# Patient Record
Sex: Female | Born: 1990 | Race: White | Hispanic: No | Marital: Married | Smoking: Current every day smoker
Health system: Southern US, Community
[De-identification: ages and names within clinical notes are randomized; demographics above are authoritative.]

## PROBLEM LIST (undated history)

## (undated) DIAGNOSIS — N939 Abnormal uterine and vaginal bleeding, unspecified: Secondary | ICD-10-CM

## (undated) HISTORY — PX: NO PAST SURGERIES: SHX2092

---
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
## 2009-09-07 ENCOUNTER — Emergency Department (HOSPITAL_COMMUNITY): Admission: EM | Admit: 2009-09-07 | Discharge: 2009-09-07 | Payer: Self-pay | Admitting: Family Medicine

## 2009-12-26 ENCOUNTER — Inpatient Hospital Stay (HOSPITAL_COMMUNITY): Admission: AD | Admit: 2009-12-26 | Discharge: 2009-12-26 | Payer: Self-pay | Admitting: Obstetrics & Gynecology

## 2009-12-27 ENCOUNTER — Inpatient Hospital Stay (HOSPITAL_COMMUNITY): Admission: AD | Admit: 2009-12-27 | Discharge: 2009-12-27 | Payer: Self-pay | Admitting: Obstetrics and Gynecology

## 2009-12-27 ENCOUNTER — Ambulatory Visit: Payer: Self-pay | Admitting: Gynecology

## 2010-06-21 LAB — GC/CHLAMYDIA PROBE AMP, GENITAL: Chlamydia, DNA Probe: NEGATIVE

## 2010-06-21 LAB — CBC
HCT: 40.6 % (ref 36.0–46.0)
Hemoglobin: 13.9 g/dL (ref 12.0–15.0)
MCV: 95.1 fL (ref 78.0–100.0)
Platelets: 259 10*3/uL (ref 150–400)

## 2010-06-21 LAB — WET PREP, GENITAL: Trich, Wet Prep: NONE SEEN

## 2010-06-21 LAB — URINALYSIS, ROUTINE W REFLEX MICROSCOPIC
Bilirubin Urine: NEGATIVE
Glucose, UA: NEGATIVE mg/dL
Nitrite: NEGATIVE
Protein, ur: NEGATIVE mg/dL
Specific Gravity, Urine: 1.02 (ref 1.005–1.030)
Urobilinogen, UA: 0.2 mg/dL (ref 0.0–1.0)

## 2010-06-25 LAB — POCT I-STAT, CHEM 8
BUN: 13 mg/dL (ref 6–23)
Chloride: 104 mEq/L (ref 96–112)
Creatinine, Ser: 0.7 mg/dL (ref 0.4–1.2)
Glucose, Bld: 88 mg/dL (ref 70–99)
Potassium: 4.1 mEq/L (ref 3.5–5.1)
Sodium: 139 mEq/L (ref 135–145)

## 2011-01-11 LAB — COMPREHENSIVE METABOLIC PANEL WITH GFR
ALT: 14
AST: 18
Albumin: 3 — ABNORMAL LOW
Alkaline Phosphatase: 217 — ABNORMAL HIGH
BUN: 7
CO2: 21
Calcium: 9
Chloride: 108
Creatinine, Ser: 0.46
Glucose, Bld: 78
Potassium: 4.1
Sodium: 136
Total Bilirubin: 0.7
Total Protein: 6.5

## 2011-01-11 LAB — CBC
HCT: 34.6 — ABNORMAL LOW
Hemoglobin: 12.2
MCHC: 35.2
MCHC: 35.3
MCV: 92.6
Platelets: 294
RBC: 3.22 — ABNORMAL LOW
RBC: 3.73 — ABNORMAL LOW
RDW: 12.7
RDW: 13
WBC: 11.7

## 2011-01-11 LAB — SYPHILIS: RPR W/REFLEX TO RPR TITER AND TREPONEMAL ANTIBODIES, TRADITIONAL SCREENING AND DIAGNOSIS ALGORITHM: RPR Ser Ql: NONREACTIVE

## 2011-01-11 LAB — URINALYSIS, DIPSTICK ONLY
Bilirubin Urine: NEGATIVE
Glucose, UA: NEGATIVE
Hgb urine dipstick: NEGATIVE
Ketones, ur: NEGATIVE
Leukocytes, UA: NEGATIVE
Nitrite: NEGATIVE
Protein, ur: NEGATIVE
Specific Gravity, Urine: 1.01
Urobilinogen, UA: 0.2
pH: 7

## 2011-01-11 LAB — URIC ACID: Uric Acid, Serum: 4.2

## 2011-01-16 LAB — RH IMMUNE GLOBULIN WORKUP (NOT WOMEN'S HOSP)
ABO/RH(D): O NEG
Antibody Screen: NEGATIVE

## 2011-01-16 LAB — GLUCOSE TOLERANCE, 1 HOUR: Glucose, 1 Hour GTT: 64 — ABNORMAL LOW

## 2011-01-18 LAB — TORCH TITERS-IGG(TOXO/ RUB/ CMV/ HSV)
CMV IgG: 0.2 IV
Rubella IgG Scr: <5 IU/mL
Toxoplasma IgG Antibody (EIA): <5 IU/mL

## 2011-01-18 LAB — TORCH-IGM(TOXO/ RUB/ CMV/ HSV) W TITER
CMV IgM: 0.61 IV
HSV IgM Antibody Titer: 1.2 IV
Toxoplasma IgM: 0.69 IV

## 2011-03-31 ENCOUNTER — Inpatient Hospital Stay (HOSPITAL_COMMUNITY): Payer: Medicaid Other

## 2011-03-31 ENCOUNTER — Emergency Department (INDEPENDENT_AMBULATORY_CARE_PROVIDER_SITE_OTHER)
Admission: EM | Admit: 2011-03-31 | Discharge: 2011-03-31 | Disposition: A | Discharge status: Home / Self Care | Payer: Medicaid Other | Source: Home / Self Care

## 2011-03-31 ENCOUNTER — Inpatient Hospital Stay (HOSPITAL_COMMUNITY)
Admission: AD | Admit: 2011-03-31 | Discharge: 2011-03-31 | Disposition: A | Discharge status: Home / Self Care | Payer: Medicaid Other | Source: Ambulatory Visit | Attending: Obstetrics & Gynecology | Admitting: Obstetrics & Gynecology

## 2011-03-31 ENCOUNTER — Encounter: Payer: Self-pay | Admitting: *Deleted

## 2011-03-31 DIAGNOSIS — A499 Bacterial infection, unspecified: Secondary | ICD-10-CM | POA: Insufficient documentation

## 2011-03-31 DIAGNOSIS — N760 Acute vaginitis: Secondary | ICD-10-CM

## 2011-03-31 DIAGNOSIS — B9689 Other specified bacterial agents as the cause of diseases classified elsewhere: Secondary | ICD-10-CM | POA: Insufficient documentation

## 2011-03-31 DIAGNOSIS — R102 Pelvic and perineal pain: Secondary | ICD-10-CM

## 2011-03-31 DIAGNOSIS — N949 Unspecified condition associated with female genital organs and menstrual cycle: Secondary | ICD-10-CM

## 2011-03-31 DIAGNOSIS — R109 Unspecified abdominal pain: Secondary | ICD-10-CM | POA: Insufficient documentation

## 2011-03-31 LAB — POCT URINALYSIS DIP (DEVICE)
Glucose, UA: NEGATIVE mg/dL
Ketones, ur: 15 mg/dL — AB
Nitrite: NEGATIVE
Protein, ur: 30 mg/dL — AB
Specific Gravity, Urine: >=1.03 (ref 1.005–1.030)
Urobilinogen, UA: 1 mg/dL (ref 0.0–1.0)
pH: 5.5 (ref 5.0–8.0)

## 2011-03-31 LAB — WET PREP, GENITAL

## 2011-03-31 MED ORDER — IBUPROFEN 800 MG PO TABS
800.0000 mg | ORAL_TABLET | Freq: Once | ORAL | Status: AC
  Administered 2011-03-31: 800 mg via ORAL

## 2011-03-31 MED ORDER — IBUPROFEN 800 MG PO TABS
ORAL_TABLET | ORAL | Status: AC
  Filled 2011-03-31: qty 1

## 2011-03-31 MED ORDER — METRONIDAZOLE 500 MG PO TABS: 500.0000 mg | ORAL_TABLET | Freq: Two times a day (BID) | ORAL | Status: AC

## 2011-03-31 NOTE — ED Notes (Signed)
Report to nurse MAU and PA spoke with NP

## 2011-03-31 NOTE — Progress Notes (Signed)
Pt reports having lower abd pain for 2 weeks. Went to urgent care and they did a pelvic exam and they could not find her string for her mirena IUD. Sent to MAU for further eval.

## 2011-03-31 NOTE — Progress Notes (Signed)
Pt presents to MAU with chief complaint of abdominal pain. Pt was sent from Urgent care for further evaluation. Non pregnant female.

## 2011-03-31 NOTE — ED Provider Notes (Signed)
History     Chief Complaint  Patient presents with  . Abdominal Pain   HPI 20 y.o. female sent from Urgent Care for further eval d/t pelvic pain, intermittent x 2 weeks. IUD in place - not able to see strings on exam at Urgent Care. Pelvic exam there revealed yellow vaginal d/c and bilateral adnexal and uterine tenderness. Pt was given Motrin 800 mg prior to leaving Urgent Care, states pain is somewhat decreased.    No past medical history on file.  No past surgical history on file.  Family History  Problem Relation Age of Onset  . Diabetes Father     History  Substance Use Topics  . Smoking status: Current Everyday Smoker  . Smokeless tobacco: Not on file  . Alcohol Use: No    Allergies: No Known Allergies  No prescriptions prior to admission    Review of Systems  Constitutional: Negative.   Respiratory: Negative.   Cardiovascular: Negative.   Gastrointestinal: Positive for abdominal pain. Negative for nausea, vomiting, diarrhea and constipation.  Genitourinary: Negative for dysuria, urgency, frequency, hematuria and flank pain.       Negative for vaginal bleeding, Positive for vaginal discharge   Musculoskeletal: Negative.   Neurological: Negative.   Psychiatric/Behavioral: Negative.    Physical Exam   Blood pressure 125/89, pulse 111, temperature 97.5 F (36.4 C), temperature source Oral, resp. rate 18, height 5' (1.524 m), weight 131 lb 12.8 oz (59.784 kg).  Physical Exam  Nursing note and vitals reviewed. Constitutional: She is oriented to person, place, and time. She appears well-developed and well-nourished. No distress.  Neurological: She is alert and oriented to person, place, and time.  Skin: Skin is warm and dry.  Psychiatric: She has a normal mood and affect.    MAU Course  Procedures  US Transvaginal Non-ob  03/31/2011  *RADIOLOGY REPORT*  Clinical Data: Pelvic pain.  IUD.  TRANSABDOMINAL AND TRANSVAGINAL ULTRASOUND OF PELVIS  Technique:   Both transabdominal and transvaginal ultrasound examinations of the pelvis were performed.  Transabdominal technique was performed for global imaging of the pelvis including uterus, ovaries, adnexal regions, and pelvic cul-de-sac.  It was necessary to proceed with endovaginal exam following the transabdominal exam to visualize the IUD and adnexa.  Comparison:  12/27/2009  Findings: Uterus:  6.8 x 3.2 x 3.8 cm.  No fibroids or other uterine masses are identified.  Endometrium: IUD is seen and normal location within the endometrial cavity. This confirmed on 3-D volume imaging.  Double layer endometrial thickness measures up to 5 mm transvaginally.  Right ovary: Normal appearance/no adnexal mass  Left ovary: Normal appearance/no adnexal mass  Other Findings:  No free fluid  IMPRESSION:  1.  Normal study.  No evidence of pelvic mass or other significant abnormality. 2.  IUD in normal position in the endometrial cavity.  Original Report Authenticated By: Danae Orleans, M.D.   US Pelvis Complete  03/31/2011  *RADIOLOGY REPORT*  Clinical Data: Pelvic pain.  IUD.  TRANSABDOMINAL AND TRANSVAGINAL ULTRASOUND OF PELVIS  Technique:  Both transabdominal and transvaginal ultrasound examinations of the pelvis were performed.  Transabdominal technique was performed for global imaging of the pelvis including uterus, ovaries, adnexal regions, and pelvic cul-de-sac.  It was necessary to proceed with endovaginal exam following the transabdominal exam to visualize the IUD and adnexa.  Comparison:  12/27/2009  Findings: Uterus:  6.8 x 3.2 x 3.8 cm.  No fibroids or other uterine masses are identified.  Endometrium: IUD is seen and  normal location within the endometrial cavity. This confirmed on 3-D volume imaging.  Double layer endometrial thickness measures up to 5 mm transvaginally.  Right ovary: Normal appearance/no adnexal mass  Left ovary: Normal appearance/no adnexal mass  Other Findings:  No free fluid  IMPRESSION:  1.  Normal  study.  No evidence of pelvic mass or other significant abnormality. 2.  IUD in normal position in the endometrial cavity.  Original Report Authenticated By: Danae Orleans, M.D.   Results for orders placed during the hospital encounter of 03/31/11 (from the past 24 hour(s))  POCT URINALYSIS DIP (DEVICE)     Status: Abnormal   Collection Time   03/31/11  1:33 PM      Component Value Range   Glucose, UA NEGATIVE  NEGATIVE (mg/dL)   Bilirubin Urine MODERATE (*) NEGATIVE    Ketones, ur 15 (*) NEGATIVE (mg/dL)   Specific Gravity, Urine >=1.030  1.005 - 1.030    Hgb urine dipstick TRACE (*) NEGATIVE    pH 5.5  5.0 - 8.0    Protein, ur 30 (*) NEGATIVE (mg/dL)   Urobilinogen, UA 1.0  0.0 - 1.0 (mg/dL)   Nitrite NEGATIVE  NEGATIVE    Leukocytes, UA TRACE (*) NEGATIVE   POCT PREGNANCY, URINE     Status: Normal   Collection Time   03/31/11  1:38 PM      Component Value Range   Preg Test, Ur NEGATIVE    WET PREP, GENITAL     Status: Abnormal   Collection Time   03/31/11  1:50 PM      Component Value Range   Yeast, Wet Prep NONE SEEN  NONE SEEN    Trich, Wet Prep NONE SEEN  NONE SEEN    Clue Cells, Wet Prep FEW (*) NONE SEEN    WBC, Wet Prep HPF POC MANY (*) NONE SEEN      Assessment and Plan  BV - rx flagyl, f/u if pain does not improve  Shannon Wolfe 03/31/2011, 3:52 PM

## 2011-03-31 NOTE — ED Provider Notes (Signed)
History     CSN: [id number]  Arrival date & time 03/31/11  1154   None     Chief Complaint  Patient presents with  . Vaginal Bleeding  . Vaginal Discharge    (Consider location/radiation/quality/duration/timing/severity/associated sxs/prior treatment) HPI Comments: Pt presents with c/o pelvic pain for "couple weeks."  Sharp intermittent pain. Pain is not changing and nothing seems to worsen or relieve pain. No dyspareunia. Vaginal dishcarge x 2 days - clear & yellow in color. Began having some light vaginal bleeding today. Has an IUD x 4 yrs. Last pelvic exam 4 yrs ago when IUD inserted and does not check her IUD strings. She denies any new sexual partners. No dysuria, urgency or frequency. BMs are regular. No fever or chills.   Patient is a 20 y.o. female presenting with vaginal bleeding and vaginal discharge.  Vaginal Bleeding Pertinent negatives include no chest pain, no abdominal pain and no shortness of breath.  Vaginal Discharge Pertinent negatives include no chest pain, no abdominal pain and no shortness of breath.    History reviewed. No pertinent past medical history.  History reviewed. No pertinent past surgical history.  Family History  Problem Relation Age of Onset  . Diabetes Father     History  Substance Use Topics  . Smoking status: Current Everyday Smoker  . Smokeless tobacco: Not on file  . Alcohol Use: No    OB History    Grav Para Term Preterm Abortions TAB SAB Ect Mult Living                  Review of Systems  Constitutional: Negative for fever and chills.  Respiratory: Negative for shortness of breath.   Cardiovascular: Negative for chest pain.  Gastrointestinal: Negative for nausea, vomiting, abdominal pain, diarrhea and constipation.  Genitourinary: Positive for vaginal bleeding, vaginal discharge and pelvic pain. Negative for dysuria, urgency, frequency and vaginal pain.    Allergies  Review of patient's allergies indicates no known  allergies.  Home Medications  No current outpatient prescriptions on file.  BP 128/84  Pulse 112  Temp(Src) 98.8 F (37.1 C) (Oral)  Resp 20  SpO2 98%  Physical Exam  Nursing note and vitals reviewed. Constitutional: She appears well-developed and well-nourished. No distress.  HENT:  Head: Normocephalic and atraumatic.  Right Ear: Tympanic membrane, external ear and ear canal normal.  Left Ear: Tympanic membrane, external ear and ear canal normal.  Nose: Nose normal.  Mouth/Throat: Uvula is midline, oropharynx is clear and moist and mucous membranes are normal. No oropharyngeal exudate, posterior oropharyngeal edema or posterior oropharyngeal erythema.  Neck: Neck supple.  Cardiovascular: Normal rate, regular rhythm and normal heart sounds.   Pulmonary/Chest: Effort normal and breath sounds normal. No respiratory distress.  Abdominal: Normal appearance and bowel sounds are normal. There is no hepatosplenomegaly. There is tenderness in the right lower quadrant, suprapubic area and left lower quadrant. There is no rebound, no guarding and no CVA tenderness.  Genitourinary: There is no rash, tenderness or lesion on the right labia. There is no rash, tenderness or lesion on the left labia. Uterus is tender. Uterus is not deviated, not enlarged and not fixed. Cervix exhibits no motion tenderness, no discharge and no friability. Right adnexum displays tenderness. Right adnexum displays no mass and no fullness. Left adnexum displays tenderness. Left adnexum displays no mass and no fullness. No erythema, tenderness or bleeding around the vagina. No foreign body around the vagina. Vaginal discharge (frothy yellow) found.  IUD strings not visible. Uterine tenderness on bimanual exam without CMT. Bilat adnexa mildly TTP. No vaginal bleeding seen.   Lymphadenopathy:    She has no cervical adenopathy.  Neurological: She is alert.  Skin: Skin is warm and dry.  Psychiatric: She has a normal mood  and affect.    ED Course  Procedures (including critical care time)  Labs Reviewed  POCT URINALYSIS DIP (DEVICE) - Abnormal; Notable for the following:    Bilirubin Urine MODERATE (*)    Ketones, ur 15 (*)    Hgb urine dipstick TRACE (*)    Protein, ur 30 (*)    Leukocytes, UA TRACE (*) Biochemical Testing Only. Please order routine urinalysis from main lab if confirmatory testing is needed.   All other components within normal limits  POCT PREGNANCY, URINE  POCT PREGNANCY, URINE  POCT URINALYSIS DIPSTICK  GC/CHLAMYDIA PROBE AMP, GENITAL  WET PREP, GENITAL   No results found.   1. Pelvic pain in female   2. Vaginitis       MDM  Pt is going to [facility] by private vehicle for further eval. Dorene Grebe at [facility] was notified.         Melody Comas, [place] 03/31/11 1406

## 2011-03-31 NOTE — Progress Notes (Signed)
Pt reports pain is worse now. Pain medication. ibuprofen and hydrocodone not working well. Reports last doses were yesterday

## 2011-03-31 NOTE — ED Notes (Signed)
Pt with onset of vaginal bleeding low abd pain onset this am  - per pt has an IUD x 4 years - has not had a period in the 4 year time  - pt also with c/o intermittent headaches

## 2011-04-01 LAB — GC/CHLAMYDIA PROBE AMP, GENITAL: Chlamydia, DNA Probe: NEGATIVE

## 2011-04-01 NOTE — ED Notes (Addendum)
GC/Chlamydia pending.  Wet prep: few clue cells, many WBC's. Pt. adequately treated with Flagyl.   Vassie Moselle 04/01/2011

## 2011-04-02 NOTE — ED Provider Notes (Signed)
Medical screening examination/treatment/procedure(s) were performed by non-physician practitioner and as supervising physician I was immediately available for consultation/collaboration.  LANEY,RONNIE   Ronnie Laney, MD 04/02/11 1211 

## 2011-04-03 NOTE — ED Notes (Signed)
GC/Chlamydia neg.  Shannon Wolfe 04/03/2011

## 2012-06-19 ENCOUNTER — Emergency Department (INDEPENDENT_AMBULATORY_CARE_PROVIDER_SITE_OTHER)
Admission: EM | Admit: 2012-06-19 | Discharge: 2012-06-19 | Disposition: A | Discharge status: Home / Self Care | Payer: Self-pay | Source: Home / Self Care

## 2012-06-19 ENCOUNTER — Encounter (HOSPITAL_COMMUNITY): Payer: Self-pay | Admitting: Emergency Medicine

## 2012-06-19 ENCOUNTER — Telehealth (HOSPITAL_COMMUNITY): Payer: Self-pay | Admitting: *Deleted

## 2012-06-19 DIAGNOSIS — A084 Viral intestinal infection, unspecified: Secondary | ICD-10-CM

## 2012-06-19 DIAGNOSIS — A088 Other specified intestinal infections: Secondary | ICD-10-CM

## 2012-06-19 MED ORDER — ONDANSETRON 4 MG PO TBDP
ORAL_TABLET | ORAL | Status: AC
  Filled 2012-06-19: qty 1

## 2012-06-19 MED ORDER — ONDANSETRON 4 MG PO TBDP
4.0000 mg | ORAL_TABLET | Freq: Once | ORAL | Status: AC
  Administered 2012-06-19: 4 mg via ORAL

## 2012-06-19 MED ORDER — ONDANSETRON HCL 4 MG PO TABS: 4.0000 mg | ORAL_TABLET | Freq: Four times a day (QID) | ORAL | Status: DC

## 2012-06-19 NOTE — ED Provider Notes (Signed)
History     CSN: [id number]  Arrival date & time 06/19/12  1141   None     Chief Complaint  Patient presents with  . Emesis    (Consider location/radiation/quality/duration/timing/severity/associated sxs/prior treatment) HPI Comments: 22 year old female developed vomiting and diarrhea last night. Each, TNTC . Has been awake most of the night with these symptoms. She is unable to hold down fluids without vomiting and diarrhea. She is also complaining of feeling cold and having sore abdominal muscles. She denies fever or GU symptoms. She is currently sitting up on the bed with relaxed posturing. Currently with no vomiting or diarrhea. She states her mother and other close relatives have recently had or are having the same symptoms.    History reviewed. No pertinent past medical history.  History reviewed. No pertinent past surgical history.  Family History  Problem Relation Age of Onset  . Diabetes Father     History  Substance Use Topics  . Smoking status: Current Every Day Smoker -- 0.50 packs/day    Types: Cigarettes  . Smokeless tobacco: Not on file  . Alcohol Use: No    OB History   Grav Para Term Preterm Abortions TAB SAB Ect Mult Living                  Review of Systems  Constitutional: Positive for activity change and fatigue. Negative for fever and chills.  HENT: Negative.   Respiratory: Negative.   Cardiovascular: Negative.   Gastrointestinal: Positive for nausea, vomiting and diarrhea. Negative for abdominal pain, constipation, blood in stool and abdominal distention.  Genitourinary: Negative.   Skin: Negative for color change, pallor and rash.  Neurological: Negative.   Psychiatric/Behavioral: Negative.     Allergies  Review of patient's allergies indicates no known allergies.  Home Medications   Current Outpatient Rx  Name  Route  Sig  Dispense  Refill  . ondansetron (ZOFRAN) 4 MG tablet   Oral   Take 1 tablet (4 mg total) by mouth every 6  (six) hours. For nausea or vomiting   15 tablet   0     BP 124/78  Pulse 64  Temp(Src) 98.3 F (36.8 C) (Oral)  Resp 18  SpO2 98%  Physical Exam  Constitutional: She is oriented to person, place, and time. She appears well-developed and well-nourished. No distress.  HENT:  Head: Normocephalic and atraumatic.  Eyes: Conjunctivae and EOM are normal.  Neck: Normal range of motion. Neck supple.  Cardiovascular: Normal rate, regular rhythm and normal heart sounds.   Pulmonary/Chest: Effort normal and breath sounds normal. No respiratory distress.  Abdominal: Soft. Bowel sounds are normal. She exhibits no distension and no mass. There is tenderness. There is no rebound and no guarding.  Mild tenderness in the epigastrium only.  Musculoskeletal: She exhibits no edema and no tenderness.  Neurological: She is alert and oriented to person, place, and time. No cranial nerve deficit.  Skin: Skin is warm and dry. There is pallor.  Psychiatric: She has a normal mood and affect.    ED Course  Procedures (including critical care time)  Labs Reviewed - No data to display No results found.   1. Viral gastroenteritis       MDM  Discharge home with a prescription of Zofran 4 mg to take by mouth q. 6 hours when necessary nausea vomiting. She is to be on a clear liquid diet for 24 hours and instructions for that are given. No solid foods for 24  hours. No milk or dairy products while having diarrhea. Start clear liquids slowly small amounts frequently. If having diarrhea greater than 4 times a day, to take one Imodium tablet and repeat in 6 hours. Do not take as instructions on the box say. You do not want to stop your diarrhea. Just slow it down. For any worsening new symptoms or problems or vomiting persistent over the hold down fluids may return.        Hayden Rasmussen, NP 06/19/12 1320  Hayden Rasmussen, NP 06/19/12 1321

## 2012-06-19 NOTE — ED Notes (Signed)
Vomiting and diarrhea since yesterday.  Reports similar symptoms 2-3 weeks ago.  Reports small specks of blood in vomit.  Patient reports soreness under ribs.  Reports generally feeling bad.

## 2012-06-19 NOTE — ED Provider Notes (Signed)
Medical screening examination/treatment/procedure(s) were performed by non-physician practitioner and as supervising physician I was immediately available for consultation/collaboration.  David Keller, M.D.  David C Keller, MD 06/19/12 2337 

## 2012-06-19 NOTE — ED Notes (Signed)
[facility] pharmacist called and said the Zofran Rx. is going to be $47.00 and pt. can't afford that. He asked if it could be changed to Phenergan for $12.00. Discussed with Dr Lorenz Coaster and he ordered Phenergan 25 mg. #20 1 q 4 hrs prn.  Pharmacist given new order. Vassie Moselle 06/19/2012

## 2012-07-22 ENCOUNTER — Encounter (HOSPITAL_COMMUNITY): Payer: Self-pay | Admitting: *Deleted

## 2012-07-22 ENCOUNTER — Other Ambulatory Visit: Payer: Self-pay

## 2012-07-22 ENCOUNTER — Emergency Department (HOSPITAL_COMMUNITY)
Admission: EM | Admit: 2012-07-22 | Discharge: 2012-07-22 | Disposition: A | Discharge status: Home / Self Care | Payer: Self-pay | Attending: Emergency Medicine | Admitting: Emergency Medicine

## 2012-07-22 ENCOUNTER — Emergency Department (HOSPITAL_COMMUNITY): Payer: Self-pay

## 2012-07-22 DIAGNOSIS — F17200 Nicotine dependence, unspecified, uncomplicated: Secondary | ICD-10-CM | POA: Insufficient documentation

## 2012-07-22 DIAGNOSIS — R0609 Other forms of dyspnea: Secondary | ICD-10-CM | POA: Insufficient documentation

## 2012-07-22 DIAGNOSIS — J9801 Acute bronchospasm: Secondary | ICD-10-CM | POA: Insufficient documentation

## 2012-07-22 DIAGNOSIS — R0989 Other specified symptoms and signs involving the circulatory and respiratory systems: Secondary | ICD-10-CM | POA: Insufficient documentation

## 2012-07-22 LAB — POCT I-STAT, CHEM 8
BUN: 7 mg/dL (ref 6–23)
Calcium, Ion: 1.2 mmol/L (ref 1.12–1.23)
Chloride: 105 mEq/L (ref 96–112)
Creatinine, Ser: 0.6 mg/dL (ref 0.50–1.10)
Glucose, Bld: 81 mg/dL (ref 70–99)

## 2012-07-22 LAB — CBC
HCT: 40.4 % (ref 36.0–46.0)
Hemoglobin: 14.5 g/dL (ref 12.0–15.0)
MCV: 88.4 fL (ref 78.0–100.0)
RBC: 4.57 MIL/uL (ref 3.87–5.11)
WBC: 11.3 10*3/uL — ABNORMAL HIGH (ref 4.0–10.5)

## 2012-07-22 MED ORDER — IPRATROPIUM BROMIDE 0.02 % IN SOLN
0.5000 mg | Freq: Once | RESPIRATORY_TRACT | Status: AC
  Administered 2012-07-22: 0.5 mg via RESPIRATORY_TRACT
  Filled 2012-07-22: qty 2.5

## 2012-07-22 MED ORDER — ALBUTEROL SULFATE HFA 108 (90 BASE) MCG/ACT IN AERS
2.0000 | INHALATION_SPRAY | RESPIRATORY_TRACT | Status: DC | PRN
  Administered 2012-07-22: 2 via RESPIRATORY_TRACT
  Filled 2012-07-22: qty 6.7

## 2012-07-22 MED ORDER — ALBUTEROL SULFATE (5 MG/ML) 0.5% IN NEBU
2.5000 mg | INHALATION_SOLUTION | Freq: Once | RESPIRATORY_TRACT | Status: AC
  Administered 2012-07-22: 2.5 mg via RESPIRATORY_TRACT
  Filled 2012-07-22: qty 0.5

## 2012-07-22 MED ORDER — PREDNISONE 20 MG PO TABS: 60.0000 mg | ORAL_TABLET | Freq: Every day | ORAL | Status: DC

## 2012-07-22 MED ORDER — PREDNISONE 20 MG PO TABS
60.0000 mg | ORAL_TABLET | Freq: Once | ORAL | Status: AC
  Administered 2012-07-22: 60 mg via ORAL
  Filled 2012-07-22: qty 3

## 2012-07-22 NOTE — ED Provider Notes (Signed)
History     CSN: [id number]  Arrival date & time 07/22/12  1725   First MD Initiated Contact with Patient 07/22/12 1923      Chief Complaint  Patient presents with  . Chest Pain    (Consider location/radiation/quality/duration/timing/severity/associated sxs/prior treatment) Patient is a 22 y.o. female presenting with chest pain. The history is provided by the patient.  Chest Pain She has been having chest pain for the last 3 days. It is described as like somebody sitting on her chest. It is worse when she is supine and better when sitting up. There is associated dyspnea but no nausea, vomiting, diaphoresis. She denies any cough or fever or chills. She thought it may have been the strap of her block causing problems but she tried adjusting her bra with no benefit. She is a cigarette smoker smoking half pack of cigarettes a day. She denies birth control pill use (she uses Mirena), recent surgery, recent long distance travel, or history of DVT.  History reviewed. No pertinent past medical history.  History reviewed. No pertinent past surgical history.  Family History  Problem Relation Age of Onset  . Diabetes Father     History  Substance Use Topics  . Smoking status: Current Every Day Smoker -- 0.50 packs/day    Types: Cigarettes  . Smokeless tobacco: Not on file  . Alcohol Use: No    OB History   Grav Para Term Preterm Abortions TAB SAB Ect Mult Living                  Review of Systems  Cardiovascular: Positive for chest pain.  All other systems reviewed and are negative.    Allergies  Review of patient's allergies indicates no known allergies.  Home Medications   Current Outpatient Rx  Name  Route  Sig  Dispense  Refill  . ondansetron (ZOFRAN) 4 MG tablet   Oral   Take 1 tablet (4 mg total) by mouth every 6 (six) hours. For nausea or vomiting   15 tablet   0     BP 132/84  Pulse 90  Temp(Src) 97.9 F (36.6 C) (Oral)  Resp 18  Ht 5' (1.524 m)  SpO2  100%  Physical Exam  Nursing note and vitals reviewed.  22 year old female, resting comfortably and in no acute distress. Vital signs are normal. Oxygen saturation is 100%, which is normal. Head is normocephalic and atraumatic. PERRLA, EOMI. Oropharynx is clear. Neck is nontender and supple without adenopathy or JVD. Back is nontender and there is no CVA tenderness. Lungs are clear without rales, wheezes, or rhonchi. There is relatively diminished air flow which seems to be related to poor effort but there is no clearly prolonged exhalation phase. Chest is nontender. Heart has regular rate and rhythm without murmur. Abdomen is soft, flat, nontender without masses or hepatosplenomegaly and peristalsis is normoactive. Extremities have no cyanosis or edema, full range of motion is present. Skin is warm and dry without rash. Neurologic: Mental status is normal, cranial nerves are intact, there are no motor or sensory deficits.  ED Course  Procedures (including critical care time)  Results for orders placed during the hospital encounter of 07/22/12  CBC      Result Value Range   WBC 11.3 (*) 4.0 - 10.5 K/uL   RBC 4.57  3.87 - 5.11 MIL/uL   Hemoglobin 14.5  12.0 - 15.0 g/dL   HCT 21.3  08.6 - 57.8 %   MCV 88.4  78.0 - 100.0 fL   MCH 31.7  26.0 - 34.0 pg   MCHC 35.9  30.0 - 36.0 g/dL   RDW 45.4  09.8 - 11.9 %   Platelets 305  150 - 400 K/uL  POCT I-STAT, CHEM 8      Result Value Range   Sodium 141  135 - 145 mEq/L   Potassium 4.0  3.5 - 5.1 mEq/L   Chloride 105  96 - 112 mEq/L   BUN 7  6 - 23 mg/dL   Creatinine, Ser 1.47  0.50 - 1.10 mg/dL   Glucose, Bld 81  70 - 99 mg/dL   Calcium, Ion 8.29  1.12 - 1.23 mmol/L   TCO2 24  0 - 100 mmol/L   Hemoglobin 15.3 (*) 12.0 - 15.0 g/dL   HCT 56.2  13.0 - 86.5 %  POCT I-STAT TROPONIN I      Result Value Range   Troponin i, poc 0.00  0.00 - 0.08 ng/mL   Comment 3            Dg Chest 2 View  07/22/2012  *RADIOLOGY REPORT*  Clinical Data:  Chest pain, shortness of breath when lying down  CHEST - 2 VIEW  Comparison: None.  Findings: Cardiomediastinal silhouette is within normal limits. The lungs are clear. No pleural effusion.  No pneumothorax.  No acute osseous abnormality.  IMPRESSION: Normal chest.   Original Report Authenticated By: Christiana Pellant, M.D.     ECG shows normal sinus rhythm with a rate of 89, no ectopy. Normal axis. Normal P wave. Normal QRS. Normal intervals. Normal ST and T waves. Impression: normal ECG. No prior ECG available for comparison.   1. Bronchospasm       MDM  Chest heaviness and dyspnea which is suspicious for bronchospasm. She'll be given a therapeutic trial of albuterol with Atrovent. ECG, chest x-ray, and laboratory workup were all unremarkable. She does not have any risk factor for DVT or pulmonary embolism.  She got good relief of all symptoms with albuterol with Atrovent. She's given a dose of prednisone and an albuterol inhaler, and discharged with prescription for prednisone. She is encouraged to stop smoking.      Dione Booze, MD 07/22/12 2010

## 2012-07-22 NOTE — ED Notes (Signed)
Pt c/o sternal chest pressure and sob x 3 days whenever she lays down. Denies nausea.  VS stable.

## 2012-10-26 ENCOUNTER — Inpatient Hospital Stay (HOSPITAL_COMMUNITY)
Admission: AD | Admit: 2012-10-26 | Discharge: 2012-10-26 | Disposition: A | Discharge status: Home / Self Care | Payer: Medicaid Other | Source: Ambulatory Visit | Attending: Obstetrics & Gynecology | Admitting: Obstetrics & Gynecology

## 2012-10-26 DIAGNOSIS — N949 Unspecified condition associated with female genital organs and menstrual cycle: Secondary | ICD-10-CM | POA: Insufficient documentation

## 2012-10-26 DIAGNOSIS — Z30431 Encounter for routine checking of intrauterine contraceptive device: Secondary | ICD-10-CM | POA: Insufficient documentation

## 2012-10-26 DIAGNOSIS — Z3202 Encounter for pregnancy test, result negative: Secondary | ICD-10-CM | POA: Insufficient documentation

## 2012-10-26 DIAGNOSIS — N938 Other specified abnormal uterine and vaginal bleeding: Secondary | ICD-10-CM | POA: Insufficient documentation

## 2012-10-26 NOTE — MAU Note (Signed)
Pt states has IUD in place, has had continual spotting/bleeding for past 7 months. Here for pregnancy test. Has taken tests at home, all negative. IUD placed by [facility]. Denies pain at present. Here for upt only.

## 2012-10-26 NOTE — MAU Provider Note (Signed)
Ms. Shannon Wolfe is a 22 y.o. female who presents to MAU today for UPT. The patient states that she had IUD placed ~ 7 months ago and has had irregular spotting since then. She is concerned that she might be pregnant despite multiple negative HPTs. She has not followed-up in the office. Patient denies any other problems today and just requests UPT.   BP 113/72  Pulse 88  Temp(Src) 97.9 F (36.6 C) (Oral)  Resp 16  Ht 5' (1.524 m)  Wt 128 lb 2 oz (58.117 kg)  BMI 25.02 kg/m2 GENERAL: Well-developed, well-nourished female in no acute distress.  HEENT: Normocephalic, atraumatic.   LUNGS: Effort normal HEART: Regular rate  SKIN: Warm, dry and without erythema PSYCH: Normal mood and affect  Results for orders placed during the hospital encounter of 10/26/12 (from the past 24 hour(s))  POCT PREGNANCY, URINE     Status: None   Collection Time    10/26/12  1:06 PM      Result Value Range   Preg Test, Ur NEGATIVE  NEGATIVE    A: Negative pregnancy test Irregular bleeding with IUD  P: Discharge home Patient advised that irregular spotting with IUD is a common side effect Patient encouraged to follow-up with [facility] if symptoms persist or worsen Patient may return to MAU as needed or if her condition were to change or worsen  Freddi Starr, PA-C 10/26/2012 2:12 PM

## 2012-11-07 ENCOUNTER — Encounter (HOSPITAL_COMMUNITY): Payer: Self-pay

## 2012-11-07 ENCOUNTER — Emergency Department (HOSPITAL_COMMUNITY)
Admission: EM | Admit: 2012-11-07 | Discharge: 2012-11-07 | Disposition: A | Discharge status: Home / Self Care | Payer: Medicaid Other | Source: Home / Self Care

## 2012-11-07 DIAGNOSIS — J029 Acute pharyngitis, unspecified: Secondary | ICD-10-CM

## 2012-11-07 DIAGNOSIS — J309 Allergic rhinitis, unspecified: Secondary | ICD-10-CM

## 2012-11-07 LAB — POCT RAPID STREP A: Streptococcus, Group A Screen (Direct): NEGATIVE

## 2012-11-07 MED ORDER — METHYLPREDNISOLONE 4 MG PO KIT: PACK | ORAL | Status: DC

## 2012-11-07 MED ORDER — PHENYLEPHRINE-CHLORPHEN-DM 10-4-12.5 MG/5ML PO LIQD: 5.0000 mL | ORAL | Status: DC | PRN

## 2012-11-07 MED ORDER — KETOTIFEN FUMARATE 0.025 % OP SOLN: 1.0000 [drp] | Freq: Two times a day (BID) | OPHTHALMIC | Status: DC

## 2012-11-07 NOTE — ED Provider Notes (Signed)
CSN: [id number]     Arrival date & time 11/07/12  0900 History     First MD Initiated Contact with Patient 11/07/12 [phone]     Chief Complaint  Patient presents with  . Cough   (Consider location/radiation/quality/duration/timing/severity/associated sxs/prior Treatment) HPI Comments: For 1 week cough, PND, eyes itchy and draining, PND, sorethroat   Patient is a 22 y.o. female presenting with cough. The history is provided by the patient.  Cough Cough characteristics:  Productive and hacking Sputum characteristics:  Yellow Severity:  Moderate Onset quality:  Gradual Duration:  7 days Timing:  Intermittent Progression:  Unchanged Chronicity:  New Context: exposure to allergens, smoke exposure and weather changes   Relieved by:  Nothing Worsened by:  Smoking Associated symptoms: rhinorrhea   Associated symptoms: no chills, no fever and no rash     History reviewed. No pertinent past medical history. History reviewed. No pertinent past surgical history. Family History  Problem Relation Age of Onset  . Diabetes Father    History  Substance Use Topics  . Smoking status: Current Every Day Smoker -- 0.50 packs/day    Types: Cigarettes  . Smokeless tobacco: Not on file  . Alcohol Use: No   OB History   Grav Para Term Preterm Abortions TAB SAB Ect Mult Living                 Review of Systems  Constitutional: Negative for fever, chills, activity change, appetite change and fatigue.  HENT: Positive for congestion, rhinorrhea and postnasal drip. Negative for facial swelling, neck pain and neck stiffness.   Eyes: Negative.   Respiratory: Positive for cough.   Cardiovascular: Negative.   Skin: Negative for pallor and rash.  Neurological: Negative.     Allergies  Review of patient's allergies indicates no known allergies.  Home Medications   Current Outpatient Rx  Name  Route  Sig  Dispense  Refill  . ketotifen (ZADITOR) 0.025 % ophthalmic solution   Both Eyes   Place  1 drop into both eyes 2 (two) times daily.   5 mL   0   . methylPREDNISolone (MEDROL DOSEPAK) 4 MG tablet      follow package directions. Take with food   21 tablet   0   . Phenylephrine-Chlorphen-DM 01-10-11.5 MG/5ML LIQD   Oral   Take 5 mLs by mouth every 4 (four) hours as needed. May cause drowsiness   120 mL   0    BP 116/78  Pulse 93  Temp(Src) 98.1 F (36.7 C) (Oral)  Resp 19  SpO2 97% Physical Exam  Nursing note and vitals reviewed. Constitutional: She is oriented to person, place, and time. She appears well-developed and well-nourished. No distress.  HENT:  Right Ear: External ear normal.  Left Ear: External ear normal.  OP with redness. No swelling or exudates  Eyes: Conjunctivae and EOM are normal.  Neck: Normal range of motion. Neck supple.  Cardiovascular: Normal rate and regular rhythm.   Pulmonary/Chest: Effort normal. No respiratory distress. She has wheezes. She has no rales.  Musculoskeletal: Normal range of motion. She exhibits no edema.  Lymphadenopathy:    She has no cervical adenopathy.  Neurological: She is alert and oriented to person, place, and time.  Skin: Skin is warm and dry. No rash noted.  Psychiatric: She has a normal mood and affect.    ED Course   Procedures (including critical care time)  Labs Reviewed  POCT RAPID STREP A ([facility] CARE ONLY)  No results found. 1. Allergic pharyngitis   2. Allergic rhinitis due to allergen     MDM  Medrol dosepack norel cs zaditor opthalmic  Hayden Rasmussen, NP 11/07/12 0945

## 2012-11-07 NOTE — ED Provider Notes (Signed)
Medical screening examination/treatment/procedure(s) were performed by resident physician or non-physician practitioner and as supervising physician I was immediately available for consultation/collaboration.   Barkley Bruns MD.   Linna Hoff, MD 11/07/12 [phone]

## 2012-11-07 NOTE — ED Notes (Addendum)
C/o 2 week duration of cough, , yellow and blood streaked secretions; c/o pain in right ear, right eye reddened, lid swollen;' chest clear to ascultation. Posterior nasopharynx reddened

## 2012-11-09 LAB — CULTURE, GROUP A STREP

## 2012-11-10 NOTE — ED Notes (Signed)
Call from pharmacy stating that medicaid does not cover phenylephrine-chlorphen-DM. Per Dr. Artis Flock Rx changed to 10 mg zyrtec # 30 with no refills. Mw,cma

## 2012-12-02 ENCOUNTER — Encounter: Payer: Self-pay | Admitting: Obstetrics & Gynecology

## 2012-12-02 ENCOUNTER — Ambulatory Visit (INDEPENDENT_AMBULATORY_CARE_PROVIDER_SITE_OTHER): Payer: Medicaid Other | Admitting: Obstetrics & Gynecology

## 2012-12-02 VITALS — BP 112/75 | HR 94 | Temp 98.2°F | Ht 60.0 in | Wt 128.8 lb

## 2012-12-02 DIAGNOSIS — Z30432 Encounter for removal of intrauterine contraceptive device: Secondary | ICD-10-CM

## 2012-12-02 DIAGNOSIS — N760 Acute vaginitis: Secondary | ICD-10-CM

## 2012-12-02 MED ORDER — NORETHIN-ETH ESTRAD-FE BIPHAS 1 MG-10 MCG / 10 MCG PO TABS: 1.0000 | ORAL_TABLET | Freq: Every day | ORAL | Status: DC

## 2012-12-02 NOTE — Progress Notes (Signed)
.   Subjective:     Shannon Wolfe is a 22 y.o. female here for her Mirena removal.  She had it inserted 01/21/12.  Current complaints:She says that she does not feel like herself.   Personal health questionnaire reviewed: no.   Gynecologic History No LMP recorded. Patient is not currently having periods (Reason: IUD). Contraception: IUD Last Pap: N/A Last mammogram: N/A  Obstetric History OB History  No data available     The following portions of the patient's history were reviewed and updated as appropriate: allergies, current medications, past family history, past medical history, past social history, past surgical history and problem list.  Review of Systems Pertinent items are noted in HPI.    Objective:     SPEC:  IUD strings present; the IUD was removed intact; thin, yellow vaginal discharge     Assessment:    S/P IUD removal   Plan:   COCP Check wet prep by molecular probe Return prn

## 2012-12-02 NOTE — Patient Instructions (Signed)

## 2013-01-21 ENCOUNTER — Ambulatory Visit: Payer: Medicaid Other | Admitting: Obstetrics & Gynecology

## 2013-02-15 ENCOUNTER — Ambulatory Visit: Payer: Medicaid Other | Admitting: Obstetrics & Gynecology

## 2013-03-29 ENCOUNTER — Emergency Department (HOSPITAL_COMMUNITY)
Admission: EM | Admit: 2013-03-29 | Discharge: 2013-03-29 | Disposition: A | Discharge status: Home / Self Care | Payer: Medicaid Other | Source: Home / Self Care | Attending: Family Medicine | Admitting: Family Medicine

## 2013-03-29 ENCOUNTER — Other Ambulatory Visit (HOSPITAL_COMMUNITY)
Admission: RE | Admit: 2013-03-29 | Discharge: 2013-03-29 | Disposition: A | Discharge status: Home / Self Care | Payer: Medicaid Other | Source: Ambulatory Visit | Attending: Family Medicine | Admitting: Family Medicine

## 2013-03-29 ENCOUNTER — Encounter (HOSPITAL_COMMUNITY): Payer: Self-pay | Admitting: Emergency Medicine

## 2013-03-29 DIAGNOSIS — Z113 Encounter for screening for infections with a predominantly sexual mode of transmission: Secondary | ICD-10-CM | POA: Insufficient documentation

## 2013-03-29 DIAGNOSIS — K219 Gastro-esophageal reflux disease without esophagitis: Secondary | ICD-10-CM

## 2013-03-29 DIAGNOSIS — N760 Acute vaginitis: Secondary | ICD-10-CM | POA: Insufficient documentation

## 2013-03-29 LAB — POCT URINALYSIS DIP (DEVICE)
Bilirubin Urine: NEGATIVE
Ketones, ur: NEGATIVE mg/dL
Protein, ur: NEGATIVE mg/dL
Specific Gravity, Urine: 1.02 (ref 1.005–1.030)
pH: 7.5 (ref 5.0–8.0)

## 2013-03-29 MED ORDER — GI COCKTAIL ~~LOC~~
ORAL | Status: AC
  Filled 2013-03-29: qty 30

## 2013-03-29 MED ORDER — GI COCKTAIL ~~LOC~~
30.0000 mL | Freq: Once | ORAL | Status: AC
  Administered 2013-03-29: 30 mL via ORAL

## 2013-03-29 MED ORDER — ESOMEPRAZOLE MAGNESIUM 40 MG PO CPDR: 40.0000 mg | DELAYED_RELEASE_CAPSULE | Freq: Every day | ORAL | Status: DC

## 2013-03-29 NOTE — ED Notes (Signed)
C/o sob with exertion.  Tightness/pressure in chest.  Dizziness worse with lying down.   Denies hx of asthma.  Smokes 1/2 pack daily

## 2013-03-29 NOTE — ED Provider Notes (Signed)
CSN: [id number]     Arrival date & time 03/29/13  1637 History   First MD Initiated Contact with Patient 03/29/13 1738     Chief Complaint  Patient presents with  . Shortness of Breath   (Consider location/radiation/quality/duration/timing/severity/associated sxs/prior Treatment) Patient is a 22 y.o. female presenting with shortness of breath. The history is provided by the patient.  Shortness of Breath Severity:  Moderate Onset quality:  Gradual Duration:  3 months Timing:  Intermittent Progression:  Waxing and waning Chronicity:  New Worsened by:  Emotional stress Associated symptoms: cough   Associated symptoms: no abdominal pain, no chest pain, no fever and no wheezing   Risk factors: tobacco use   Risk factors comment:  Smoker   History reviewed. No pertinent past medical history. History reviewed. No pertinent past surgical history. Family History  Problem Relation Age of Onset  . Diabetes Father    History  Substance Use Topics  . Smoking status: Current Every Day Smoker -- 0.50 packs/day    Types: Cigarettes  . Smokeless tobacco: Not on file  . Alcohol Use: No   OB History   Grav Para Term Preterm Abortions TAB SAB Ect Mult Living                 Review of Systems  Constitutional: Negative.  Negative for fever.  HENT: Negative.   Respiratory: Positive for cough and shortness of breath. Negative for wheezing.   Cardiovascular: Negative for chest pain, palpitations and leg swelling.  Gastrointestinal: Negative for abdominal pain.    Allergies  Review of patient's allergies indicates no known allergies.  Home Medications   Current Outpatient Rx  Name  Route  Sig  Dispense  Refill  . esomeprazole (NEXIUM) 40 MG capsule   Oral   Take 1 capsule (40 mg total) by mouth daily.   30 capsule   1   . ketotifen (ZADITOR) 0.025 % ophthalmic solution   Both Eyes   Place 1 drop into both eyes 2 (two) times daily.   5 mL   0   . methylPREDNISolone (MEDROL  DOSEPAK) 4 MG tablet      follow package directions. Take with food   21 tablet   0   . Norethindrone-Ethinyl Estradiol-Fe Biphas (LO LOESTRIN FE) 1 MG-10 MCG / 10 MCG tablet   Oral   Take 1 tablet by mouth daily.   1 Package   11   . Phenylephrine-Chlorphen-DM 01-10-11.5 MG/5ML LIQD   Oral   Take 5 mLs by mouth every 4 (four) hours as needed. May cause drowsiness   120 mL   0    BP 121/82  Pulse 88  Temp(Src) 98 F (36.7 C) (Oral)  Resp 14  SpO2 99% Physical Exam  Nursing note and vitals reviewed. Constitutional: She is oriented to person, place, and time. She appears well-developed and well-nourished.  HENT:  Head: Normocephalic.  Right Ear: External ear normal.  Left Ear: External ear normal.  Mouth/Throat: Oropharynx is clear and moist.  Neck: Normal range of motion. Neck supple.  Cardiovascular: Normal rate, regular rhythm, normal heart sounds and intact distal pulses.   Pulmonary/Chest: Effort normal and breath sounds normal.  Lymphadenopathy:    She has no cervical adenopathy.  Neurological: She is alert and oriented to person, place, and time.  Skin: Skin is warm and dry.    ED Course  Procedures (including critical care time) Labs Review Labs Reviewed  POCT PREGNANCY, URINE  POCT URINALYSIS DIP (DEVICE)  URINE CYTOLOGY ANCILLARY ONLY   Imaging Review No results found.  EKG Interpretation    Date/Time:    Ventricular Rate:    PR Interval:    QRS Duration:   QT Interval:    QTC Calculation:   R Axis:     Text Interpretation:              MDM  Sx improved after meds.    Linna Hoff, MD 03/29/13 [phone]

## 2013-03-30 ENCOUNTER — Encounter (HOSPITAL_COMMUNITY): Payer: Self-pay | Admitting: Emergency Medicine

## 2013-03-30 ENCOUNTER — Emergency Department (INDEPENDENT_AMBULATORY_CARE_PROVIDER_SITE_OTHER)
Admission: EM | Admit: 2013-03-30 | Discharge: 2013-03-30 | Disposition: A | Discharge status: Home / Self Care | Payer: Medicaid Other | Source: Home / Self Care | Attending: Family Medicine | Admitting: Family Medicine

## 2013-03-30 DIAGNOSIS — A0811 Acute gastroenteropathy due to Norwalk agent: Secondary | ICD-10-CM

## 2013-03-30 DIAGNOSIS — K219 Gastro-esophageal reflux disease without esophagitis: Secondary | ICD-10-CM

## 2013-03-30 MED ORDER — ONDANSETRON 4 MG PO TBDP
8.0000 mg | ORAL_TABLET | Freq: Once | ORAL | Status: AC
  Administered 2013-03-30: 8 mg via ORAL

## 2013-03-30 MED ORDER — ONDANSETRON HCL 4 MG PO TABS: 4.0000 mg | ORAL_TABLET | Freq: Four times a day (QID) | ORAL | Status: DC

## 2013-03-30 MED ORDER — ONDANSETRON 4 MG PO TBDP
ORAL_TABLET | ORAL | Status: AC
Start: 2013-03-30 — End: 2013-03-30
  Filled 2013-03-30: qty 2

## 2013-03-30 MED ORDER — OMEPRAZOLE 40 MG PO CPDR: 40.0000 mg | DELAYED_RELEASE_CAPSULE | Freq: Every day | ORAL | Status: DC

## 2013-03-30 NOTE — ED Notes (Signed)
Patient called to c/o her medication is not covered under her insurance, and has also developed abdominal pain, diarrhea.Advised if she has new syx, she needs to be rechecked

## 2013-03-30 NOTE — ED Notes (Signed)
Pt  Reports     She       Was seen  Last        Pm  For   Nausea   Vomiting    And      geard     She  Reports  Today  She  Has  Had  Vomiting             And  Chills  With  Body  Aches         Pt     Reports  Did  Not  Get  Her  meds  Filled  Because  Of  Cost

## 2013-03-30 NOTE — ED Provider Notes (Signed)
CSN: [id number]     Arrival date & time 03/30/13  1305 History   First MD Initiated Contact with Patient 03/30/13 1534     Chief Complaint  Patient presents with  . Nausea   (Consider location/radiation/quality/duration/timing/severity/associated sxs/prior Treatment) Patient is a 22 y.o. female presenting with vomiting. The history is provided by the patient.  Emesis Severity:  Moderate Duration:  1 day Timing:  Intermittent Quality:  Stomach contents Progression:  Unchanged Chronicity:  New (seen last eve at [facility] for gerd, developed n/v after leaving here, didn't get meds b/o cost, here for recheck.) Associated symptoms: diarrhea     History reviewed. No pertinent past medical history. History reviewed. No pertinent past surgical history. Family History  Problem Relation Age of Onset  . Diabetes Father    History  Substance Use Topics  . Smoking status: Current Every Day Smoker -- 0.50 packs/day    Types: Cigarettes  . Smokeless tobacco: Not on file  . Alcohol Use: No   OB History   Grav Para Term Preterm Abortions TAB SAB Ect Mult Living                 Review of Systems  Constitutional: Negative.   Gastrointestinal: Positive for nausea, vomiting and diarrhea.    Allergies  Review of patient's allergies indicates no known allergies.  Home Medications   Current Outpatient Rx  Name  Route  Sig  Dispense  Refill  . esomeprazole (NEXIUM) 40 MG capsule   Oral   Take 1 capsule (40 mg total) by mouth daily.   30 capsule   1   . ketotifen (ZADITOR) 0.025 % ophthalmic solution   Both Eyes   Place 1 drop into both eyes 2 (two) times daily.   5 mL   0   . methylPREDNISolone (MEDROL DOSEPAK) 4 MG tablet      follow package directions. Take with food   21 tablet   0   . Norethindrone-Ethinyl Estradiol-Fe Biphas (LO LOESTRIN FE) 1 MG-10 MCG / 10 MCG tablet   Oral   Take 1 tablet by mouth daily.   1 Package   11   . omeprazole (PRILOSEC) 40 MG capsule  Oral   Take 1 capsule (40 mg total) by mouth daily.   30 capsule   1   . ondansetron (ZOFRAN) 4 MG tablet   Oral   Take 1 tablet (4 mg total) by mouth every 6 (six) hours. Prn n/v   6 tablet   0   . Phenylephrine-Chlorphen-DM 01-10-11.5 MG/5ML LIQD   Oral   Take 5 mLs by mouth every 4 (four) hours as needed. May cause drowsiness   120 mL   0    BP 114/79  Pulse 84  Temp(Src) 98.4 F (36.9 C) (Oral)  Resp 16  SpO2 100% Physical Exam  Nursing note and vitals reviewed. Constitutional: She is oriented to person, place, and time. She appears well-developed and well-nourished.  Neck: Normal range of motion. Neck supple.  Abdominal: Soft. Bowel sounds are normal. There is no tenderness.  Neurological: She is alert and oriented to person, place, and time.  Skin: Skin is warm and dry.    ED Course  Procedures (including critical care time) Labs Review Labs Reviewed - No data to display Imaging Review No results found.  EKG Interpretation    Date/Time:    Ventricular Rate:    PR Interval:    QRS Duration:   QT Interval:    QTC  Calculation:   R Axis:     Text Interpretation:              MDM      Linna Hoff, MD 03/30/13 [phone]

## 2013-04-03 ENCOUNTER — Telehealth (HOSPITAL_COMMUNITY): Payer: Self-pay

## 2013-04-03 MED ORDER — AZITHROMYCIN 250 MG PO TABS: ORAL_TABLET | ORAL | Status: DC

## 2013-04-04 ENCOUNTER — Telehealth (HOSPITAL_COMMUNITY): Payer: Self-pay | Admitting: Emergency Medicine

## 2013-04-04 MED ORDER — AZITHROMYCIN 500 MG PO TABS: ORAL_TABLET | ORAL | Status: DC

## 2013-04-04 NOTE — Telephone Encounter (Signed)
Message copied by Reuben Likes on Sun Apr 04, 2013  6:36 PM ------      Message from: Vassie Moselle      Created: Sun Apr 04, 2013  5:46 PM      Regarding: lab       Chlamydia pos.  Needs treatment. Pt. of Dr. Artis Flock.      Vassie Moselle      04/04/2013       ------

## 2013-04-04 NOTE — Telephone Encounter (Signed)
Patient aware of lab results and treatment

## 2013-04-04 NOTE — ED Notes (Signed)
DNA probe for Chlamydia came back positive. She was not treated for this when she was here. Will need azithromycin 1000 mg.  Reuben Likes, MD 04/04/13 [phone]

## 2013-04-05 ENCOUNTER — Telehealth (HOSPITAL_COMMUNITY): Payer: Self-pay | Admitting: *Deleted

## 2013-04-05 NOTE — ED Notes (Addendum)
Chart reviewed and Dr. Artis Flock had done an order for Zithromax 12/27, Dr. Lorenz Coaster did one on 12/28 and Hale Drone CMA called one to [facility] 12/29.  I called pt. Pt. verified x 2 and given results.  Pt. told she only needs the one Rx. of Zithromax.  I told her to take it with food and call back if she vomits up the medication.  Pt. instructed to notify her partner (she said he was already treated), no sex for 1 week and to practice safe sex. Pt. told she can get HIV testing at the [facility]. STD clinic, by appointment.  Pt. voiced understanding.  I called [facility] on [address] to tell them the pt. only needs one Rx. of Zithromax. They received 3 orders. She said she sees 2.  I told her one on is your VM from today.  She asked which one should she use.  I told her to use Dr. Blair Heys. Vassie Moselle 04/05/2013 DHHS form completed and faxed to the [facility] Department.

## 2013-04-05 NOTE — ED Notes (Signed)
Called in Azithromycin 1000mg  to [facility] on [address]... LM on their VM Called pt an notified her.

## 2013-08-02 ENCOUNTER — Emergency Department (HOSPITAL_COMMUNITY)
Admission: EM | Admit: 2013-08-02 | Discharge: 2013-08-02 | Disposition: A | Discharge status: Home / Self Care | Payer: Medicaid Other | Source: Home / Self Care | Attending: Emergency Medicine | Admitting: Emergency Medicine

## 2013-08-02 ENCOUNTER — Encounter (HOSPITAL_COMMUNITY): Payer: Self-pay | Admitting: Emergency Medicine

## 2013-08-02 DIAGNOSIS — L03319 Cellulitis of trunk, unspecified: Secondary | ICD-10-CM

## 2013-08-02 DIAGNOSIS — T63481A Toxic effect of venom of other arthropod, accidental (unintentional), initial encounter: Secondary | ICD-10-CM

## 2013-08-02 DIAGNOSIS — L02219 Cutaneous abscess of trunk, unspecified: Secondary | ICD-10-CM

## 2013-08-02 DIAGNOSIS — X58XXXA Exposure to other specified factors, initial encounter: Secondary | ICD-10-CM

## 2013-08-02 DIAGNOSIS — T6391XA Toxic effect of contact with unspecified venomous animal, accidental (unintentional), initial encounter: Secondary | ICD-10-CM

## 2013-08-02 DIAGNOSIS — L03311 Cellulitis of abdominal wall: Secondary | ICD-10-CM

## 2013-08-02 MED ORDER — DIPHENHYDRAMINE-ZINC ACETATE 1-0.1 % EX CREA: TOPICAL_CREAM | Freq: Three times a day (TID) | CUTANEOUS | Status: DC | PRN

## 2013-08-02 MED ORDER — DOXYCYCLINE HYCLATE 100 MG PO CAPS: 100.0000 mg | ORAL_CAPSULE | Freq: Two times a day (BID) | ORAL | Status: DC

## 2013-08-02 NOTE — ED Notes (Signed)
C/o insect bite on mid stomach area States area is painful and radiates to belly button States area does itch Pus has came out of a small puncture wound on stomach which was yellowish and white

## 2013-08-02 NOTE — ED Provider Notes (Signed)
CSN: [id number]     Arrival date & time 08/02/13  0109 History   First MD Initiated Contact with Patient 08/02/13 [phone]     Chief Complaint  Patient presents with  . Insect Bite   (Consider location/radiation/quality/duration/timing/severity/associated sxs/prior Treatment) HPI Comments: States she thinks she was stung by an insect 2 days ago while walking in woods. Was stung on lower anterior abdomen and area has become more red, tender and swollen over the past two days. No medications taken at home. No fever/chills. PCP: [place]  The history is provided by the patient.    History reviewed. No pertinent past medical history. History reviewed. No pertinent past surgical history. Family History  Problem Relation Age of Onset  . Diabetes Father    History  Substance Use Topics  . Smoking status: Current Every Day Smoker -- 0.50 packs/day    Types: Cigarettes  . Smokeless tobacco: Not on file  . Alcohol Use: No   OB History   Grav Para Term Preterm Abortions TAB SAB Ect Mult Living                 Review of Systems  All other systems reviewed and are negative.   Allergies  Review of patient's allergies indicates no known allergies.  Home Medications   Prior to Admission medications   Medication Sig Start Date End Date Taking? Authorizing Provider  azithromycin (ZITHROMAX) 250 MG tablet Take all 4 pills as one dose, one time 04/03/13   Billy Fischer, MD  azithromycin (ZITHROMAX) 500 MG tablet Take 2 tablets at one time. 04/04/13   Harden Mo, MD  esomeprazole (NEXIUM) 40 MG capsule Take 1 capsule (40 mg total) by mouth daily. 03/29/13   Billy Fischer, MD  ketotifen (ZADITOR) 0.025 % ophthalmic solution Place 1 drop into both eyes 2 (two) times daily. 11/07/12   Janne Napoleon, NP  methylPREDNISolone (MEDROL DOSEPAK) 4 MG tablet follow package directions. Take with food 11/07/12   Janne Napoleon, NP  Norethindrone-Ethinyl Estradiol-Fe Biphas (LO LOESTRIN FE) 1 MG-10  MCG / 10 MCG tablet Take 1 tablet by mouth daily. 12/02/12   Lahoma Crocker, MD  omeprazole (PRILOSEC) 40 MG capsule Take 1 capsule (40 mg total) by mouth daily. 03/30/13   Billy Fischer, MD  ondansetron (ZOFRAN) 4 MG tablet Take 1 tablet (4 mg total) by mouth every 6 (six) hours. Prn n/v 03/30/13   Billy Fischer, MD  Phenylephrine-Chlorphen-DM 01-10-11.5 MG/5ML LIQD Take 5 mLs by mouth every 4 (four) hours as needed. May cause drowsiness 11/07/12   Janne Napoleon, NP   BP 114/78  Pulse 81  Temp(Src) 97.7 F (36.5 C) (Oral)  Resp 16  SpO2 99% Physical Exam  Nursing note and vitals reviewed. Constitutional: She is oriented to person, place, and time. She appears well-developed and well-nourished. No distress.  HENT:  Head: Normocephalic and atraumatic.  Eyes: Conjunctivae are normal.  Cardiovascular: Normal rate.   Pulmonary/Chest: Effort normal.  Abdominal: Soft. Bowel sounds are normal.    Outline area is area of erythema and induration without palpable fluctuance. Small grey stinger visible in center of lesion. This was removed with forceps.   Musculoskeletal: Normal range of motion.  Neurological: She is alert and oriented to person, place, and time.  Skin: Skin is warm and dry. Rash noted. There is erythema.  Psychiatric: She has a normal mood and affect. Her behavior is normal.    ED Course  Procedures (including critical care  time) Labs Review Labs Reviewed - No data to display  Imaging Review No results found.   MDM   1. Insect sting   2. Cellulitis of abdominal wall    Warm compresses QID, tylenol or ibuprofen for discomfort. Topical benadryl for itch and doxycycline as prescribed for cellulitis. Area outlined with surgical pen and patient advised to return for re-check in 48 hours.     [place], [place] 08/02/13 1014

## 2013-08-02 NOTE — Discharge Instructions (Signed)
Warm compresses 4 x day until healed. Medications as prescribed. Return for re-check in 2 days. Tylenol or ibuprofen for pain Cellulitis Cellulitis is an infection of the skin and the tissue beneath it. The infected area is usually red and tender. Cellulitis occurs most often in the arms and lower legs.  CAUSES  Cellulitis is caused by bacteria that enter the skin through cracks or cuts in the skin. The most common types of bacteria that cause cellulitis are Staphylococcus and Streptococcus. SYMPTOMS   Redness and warmth.  Swelling.  Tenderness or pain.  Fever. DIAGNOSIS  Your caregiver can usually determine what is wrong based on a physical exam. Blood tests may also be done. TREATMENT  Treatment usually involves taking an antibiotic medicine. HOME CARE INSTRUCTIONS   Take your antibiotics as directed. Finish them even if you start to feel better.  Keep the infected arm or leg elevated to reduce swelling.  Apply a warm cloth to the affected area up to 4 times per day to relieve pain.  Only take over-the-counter or prescription medicines for pain, discomfort, or fever as directed by your caregiver.  Keep all follow-up appointments as directed by your caregiver. SEEK MEDICAL CARE IF:   You notice red streaks coming from the infected area.  Your red area gets larger or turns dark in color.  Your bone or joint underneath the infected area becomes painful after the skin has healed.  Your infection returns in the same area or another area.  You notice a swollen bump in the infected area.  You develop new symptoms. SEEK IMMEDIATE MEDICAL CARE IF:   You have a fever.  You feel very sleepy.  You develop vomiting or diarrhea.  You have a general ill feeling (malaise) with muscle aches and pains. MAKE SURE YOU:   Understand these instructions.  Will watch your condition.  Will get help right away if you are not doing well or get worse. Document Released: 01/02/2005  Document Revised: 09/24/2011 Document Reviewed: 06/10/2011 [facility] Patient Information 2014 [place].

## 2013-08-02 NOTE — ED Provider Notes (Signed)
Medical screening examination/treatment/procedure(s) were performed by non-physician practitioner and as supervising physician I was immediately available for consultation/collaboration.  Philipp Deputy, M.D.  Harden Mo, MD 08/02/13 [phone]

## 2013-08-04 ENCOUNTER — Encounter (HOSPITAL_COMMUNITY): Payer: Self-pay | Admitting: Emergency Medicine

## 2013-08-04 ENCOUNTER — Emergency Department (INDEPENDENT_AMBULATORY_CARE_PROVIDER_SITE_OTHER)
Admission: EM | Admit: 2013-08-04 | Discharge: 2013-08-04 | Disposition: A | Discharge status: Home / Self Care | Payer: Medicaid Other | Source: Home / Self Care | Attending: Family Medicine | Admitting: Family Medicine

## 2013-08-04 DIAGNOSIS — L02219 Cutaneous abscess of trunk, unspecified: Secondary | ICD-10-CM

## 2013-08-04 DIAGNOSIS — L03319 Cellulitis of trunk, unspecified: Secondary | ICD-10-CM

## 2013-08-04 DIAGNOSIS — L03311 Cellulitis of abdominal wall: Secondary | ICD-10-CM

## 2013-08-04 NOTE — Discharge Instructions (Signed)
Cellulitis Cellulitis is an infection of the skin and the tissue beneath it. The infected area is usually red and tender. Cellulitis occurs most often in the arms and lower legs.  CAUSES  Cellulitis is caused by bacteria that enter the skin through cracks or cuts in the skin. The most common types of bacteria that cause cellulitis are Staphylococcus and Streptococcus. SYMPTOMS   Redness and warmth.  Swelling.  Tenderness or pain.  Fever. DIAGNOSIS  Your caregiver can usually determine what is wrong based on a physical exam. Blood tests may also be done. TREATMENT  Treatment usually involves taking an antibiotic medicine. HOME CARE INSTRUCTIONS   Take your antibiotics as directed. Finish them even if you start to feel better.  Keep the infected arm or leg elevated to reduce swelling.  Apply a warm cloth to the affected area up to 4 times per day to relieve pain.  Only take over-the-counter or prescription medicines for pain, discomfort, or fever as directed by your caregiver.  Keep all follow-up appointments as directed by your caregiver. SEEK MEDICAL CARE IF:   You notice red streaks coming from the infected area.  Your red area gets larger or turns dark in color.  Your bone or joint underneath the infected area becomes painful after the skin has healed.  Your infection returns in the same area or another area.  You notice a swollen bump in the infected area.  You develop new symptoms. SEEK IMMEDIATE MEDICAL CARE IF:   You have a fever.  You feel very sleepy.  You develop vomiting or diarrhea.  You have a general ill feeling (malaise) with muscle aches and pains. MAKE SURE YOU:   Understand these instructions.  Will watch your condition.  Will get help right away if you are not doing well or get worse. Document Released: 01/02/2005 Document Revised: 09/24/2011 Document Reviewed: 06/10/2011 [employer] Patient Information 2014 [employer].  

## 2013-08-04 NOTE — ED Notes (Signed)
Here for wound check . Initial visit 4-27. Patient states upper area of inflammation feels  Better, but redness has extended towards lower abdominal area

## 2013-08-04 NOTE — ED Provider Notes (Signed)
CSN: [id number]     Arrival date & time 08/04/13  1055 History   First MD Initiated Contact with Patient 08/04/13 1112     Chief Complaint  Patient presents with  . Wound Check   (Consider location/radiation/quality/duration/timing/severity/associated sxs/prior Treatment) HPI Comments: Patient presented and was diagnosed with lower abdominal wall cellulitis on 08-02-2013 and was prescribed oral doxycycline. Is taking medication as prescribed and reports improvement. Is here for follow up as instructed.   Patient is a 23 y.o. female presenting with wound check. The history is provided by the patient.  Wound Check    History reviewed. No pertinent past medical history. History reviewed. No pertinent past surgical history. Family History  Problem Relation Age of Onset  . Diabetes Father    History  Substance Use Topics  . Smoking status: Current Every Day Smoker -- 0.50 packs/day    Types: Cigarettes  . Smokeless tobacco: Not on file  . Alcohol Use: No   OB History   Grav Para Term Preterm Abortions TAB SAB Ect Mult Living                 Review of Systems  Constitutional: Negative for fever and chills.  All other systems reviewed and are negative.   Allergies  Review of patient's allergies indicates no known allergies.  Home Medications   Prior to Admission medications   Medication Sig Start Date End Date Taking? Authorizing Provider  azithromycin (ZITHROMAX) 250 MG tablet Take all 4 pills as one dose, one time 04/03/13  Yes Billy Fischer, MD  azithromycin (ZITHROMAX) 500 MG tablet Take 2 tablets at one time. 04/04/13  Yes Harden Mo, MD  diphenhydrAMINE-zinc acetate (BENADRYL) cream Apply topically 3 (three) times daily as needed for itching. 08/02/13  Yes Lahoma Rocker, PA  doxycycline (VIBRAMYCIN) 100 MG capsule Take 1 capsule (100 mg total) by mouth 2 (two) times daily. X 7 days 08/02/13  Yes Annett Gula Griffey Nicasio, PA  esomeprazole (NEXIUM) 40 MG capsule Take  1 capsule (40 mg total) by mouth daily. 03/29/13   Billy Fischer, MD  ketotifen (ZADITOR) 0.025 % ophthalmic solution Place 1 drop into both eyes 2 (two) times daily. 11/07/12   Janne Napoleon, NP  methylPREDNISolone (MEDROL DOSEPAK) 4 MG tablet follow package directions. Take with food 11/07/12   Janne Napoleon, NP  Norethindrone-Ethinyl Estradiol-Fe Biphas (LO LOESTRIN FE) 1 MG-10 MCG / 10 MCG tablet Take 1 tablet by mouth daily. 12/02/12   Lahoma Crocker, MD  omeprazole (PRILOSEC) 40 MG capsule Take 1 capsule (40 mg total) by mouth daily. 03/30/13   Billy Fischer, MD  ondansetron (ZOFRAN) 4 MG tablet Take 1 tablet (4 mg total) by mouth every 6 (six) hours. Prn n/v 03/30/13   Billy Fischer, MD  Phenylephrine-Chlorphen-DM 01-10-11.5 MG/5ML LIQD Take 5 mLs by mouth every 4 (four) hours as needed. May cause drowsiness 11/07/12   Janne Napoleon, NP   BP 115/86  Pulse 86  Temp(Src) 99 F (37.2 C) (Oral)  Resp 10  SpO2 100% Physical Exam  Nursing note and vitals reviewed. Constitutional: She is oriented to person, place, and time. She appears well-developed and well-nourished. No distress.  HENT:  Head: Normocephalic and atraumatic.  Eyes: Conjunctivae are normal.  Cardiovascular: Normal rate.   Pulmonary/Chest: Effort normal.  Abdominal: Soft.    Small residual area of mild erythema. Noticeable improvement from 08-02-2012 visit. No induration. No fluctuance.   Musculoskeletal: Normal range of motion.  Neurological: She is  alert and oriented to person, place, and time.  Skin: Skin is warm and dry.  Psychiatric: She has a normal mood and affect. Her behavior is normal.    ED Course  Procedures (including critical care time) Labs Review Labs Reviewed - No data to display  Imaging Review No results found.   MDM   1. Cellulitis of abdominal wall    Overall improvement. Continue warm compresses QID until healed. Topical benadryl or 1% hydrocortisone cream for itching and complete doxycycline as  prescribed. Follow up prn. Majority of involved area has receded within borders drawn with surgical marker on 08-02-2013.     [place], [place] 08/04/13 1139

## 2013-08-04 NOTE — ED Provider Notes (Signed)
Medical screening examination/treatment/procedure(s) were performed by a resident physician or non-physician practitioner and as the supervising physician I was immediately available for consultation/collaboration.  Lynne Leader, MD    Gregor Hams, MD 08/04/13 [phone]

## 2013-09-09 ENCOUNTER — Emergency Department (HOSPITAL_COMMUNITY)
Admission: EM | Admit: 2013-09-09 | Discharge: 2013-09-09 | Disposition: A | Discharge status: Home / Self Care | Payer: Medicaid Other | Source: Home / Self Care | Attending: Family Medicine | Admitting: Family Medicine

## 2013-09-09 ENCOUNTER — Encounter (HOSPITAL_COMMUNITY): Payer: Self-pay | Admitting: Emergency Medicine

## 2013-09-09 DIAGNOSIS — R0789 Other chest pain: Secondary | ICD-10-CM

## 2013-09-09 DIAGNOSIS — R071 Chest pain on breathing: Secondary | ICD-10-CM

## 2013-09-09 MED ORDER — DICLOFENAC POTASSIUM 50 MG PO TABS: 50.0000 mg | ORAL_TABLET | Freq: Three times a day (TID) | ORAL | Status: DC

## 2013-09-09 NOTE — ED Provider Notes (Signed)
CSN: [id number]     Arrival date & time 09/09/13  1706 History   First MD Initiated Contact with Patient 09/09/13 1732     Chief Complaint  Patient presents with  . Chest Pain   (Consider location/radiation/quality/duration/timing/severity/associated sxs/prior Treatment) Patient is a 23 y.o. female presenting with chest pain. The history is provided by the patient.  Chest Pain Pain location:  Substernal area Pain quality: sharp   Pain radiates to:  Does not radiate Pain radiates to the back: no   Pain severity:  Mild Onset quality:  Gradual Duration:  1 day Progression:  Unchanged Chronicity:  New Context: movement   Relieved by:  None tried Worsened by:  Nothing tried Ineffective treatments:  None tried Associated symptoms: no abdominal pain, no back pain, no palpitations and no shortness of breath     History reviewed. No pertinent past medical history. History reviewed. No pertinent past surgical history. Family History  Problem Relation Age of Onset  . Diabetes Father    History  Substance Use Topics  . Smoking status: Current Every Day Smoker -- 0.50 packs/day    Types: Cigarettes  . Smokeless tobacco: Not on file  . Alcohol Use: No   OB History   Grav Para Term Preterm Abortions TAB SAB Ect Mult Living                 Review of Systems  Constitutional: Negative.   Respiratory: Negative for chest tightness, shortness of breath and wheezing.   Cardiovascular: Positive for chest pain. Negative for palpitations and leg swelling.  Gastrointestinal: Negative.  Negative for abdominal pain.  Musculoskeletal: Negative for back pain.    Allergies  Review of patient's allergies indicates no known allergies.  Home Medications   Prior to Admission medications   Medication Sig Start Date End Date Taking? Authorizing Provider  azithromycin (ZITHROMAX) 250 MG tablet Take all 4 pills as one dose, one time 04/03/13   Billy Fischer, MD  azithromycin (ZITHROMAX) 500 MG  tablet Take 2 tablets at one time. 04/04/13   Harden Mo, MD  diclofenac (CATAFLAM) 50 MG tablet Take 1 tablet (50 mg total) by mouth 3 (three) times daily. Prn chest wall pain 09/09/13   Billy Fischer, MD  diphenhydrAMINE-zinc acetate (BENADRYL) cream Apply topically 3 (three) times daily as needed for itching. 08/02/13   Lahoma Rocker, PA  doxycycline (VIBRAMYCIN) 100 MG capsule Take 1 capsule (100 mg total) by mouth 2 (two) times daily. X 7 days 08/02/13   Lahoma Rocker, PA  esomeprazole (NEXIUM) 40 MG capsule Take 1 capsule (40 mg total) by mouth daily. 03/29/13   Billy Fischer, MD  ketotifen (ZADITOR) 0.025 % ophthalmic solution Place 1 drop into both eyes 2 (two) times daily. 11/07/12   Janne Napoleon, NP  methylPREDNISolone (MEDROL DOSEPAK) 4 MG tablet follow package directions. Take with food 11/07/12   Janne Napoleon, NP  Norethindrone-Ethinyl Estradiol-Fe Biphas (LO LOESTRIN FE) 1 MG-10 MCG / 10 MCG tablet Take 1 tablet by mouth daily. 12/02/12   Lahoma Crocker, MD  omeprazole (PRILOSEC) 40 MG capsule Take 1 capsule (40 mg total) by mouth daily. 03/30/13   Billy Fischer, MD  ondansetron (ZOFRAN) 4 MG tablet Take 1 tablet (4 mg total) by mouth every 6 (six) hours. Prn n/v 03/30/13   Billy Fischer, MD  Phenylephrine-Chlorphen-DM 01-10-11.5 MG/5ML LIQD Take 5 mLs by mouth every 4 (four) hours as needed. May cause drowsiness 11/07/12   Shanon Brow  Mabe, NP   BP 106/62  Pulse 94  Temp(Src) 98.3 F (36.8 C) (Oral)  Resp 16  SpO2 98% Physical Exam  Nursing note and vitals reviewed. Constitutional: She is oriented to person, place, and time. She appears well-developed and well-nourished.  Neck: Normal range of motion. Neck supple. No thyromegaly present.  Cardiovascular: Normal heart sounds.   Pulmonary/Chest: Effort normal and breath sounds normal. She exhibits tenderness.  Neurological: She is alert and oriented to person, place, and time.  Skin: Skin is warm and dry.    ED Course   Procedures (including critical care time) Labs Review Labs Reviewed - No data to display  Imaging Review No results found.   MDM   1. Anterior chest wall pain        Billy Fischer, MD 09/09/13 [phone]

## 2013-09-09 NOTE — ED Notes (Signed)
Pt  Reports  Last  Pm  She  Developed chest  Pain   aggrevated by  Movement  And    Certain    posistions     Sitting upright on     Exam table   Speaking in  Complete  sentances

## 2014-01-05 ENCOUNTER — Encounter (HOSPITAL_COMMUNITY): Payer: Self-pay | Admitting: Emergency Medicine

## 2014-01-05 ENCOUNTER — Emergency Department (HOSPITAL_COMMUNITY)
Admission: EM | Admit: 2014-01-05 | Discharge: 2014-01-05 | Disposition: A | Discharge status: Home / Self Care | Payer: Medicaid Other | Source: Home / Self Care | Attending: Emergency Medicine | Admitting: Emergency Medicine

## 2014-01-05 DIAGNOSIS — J410 Simple chronic bronchitis: Secondary | ICD-10-CM

## 2014-01-05 DIAGNOSIS — K219 Gastro-esophageal reflux disease without esophagitis: Secondary | ICD-10-CM

## 2014-01-05 MED ORDER — BENZONATATE 100 MG PO CAPS: 100.0000 mg | ORAL_CAPSULE | Freq: Three times a day (TID) | ORAL | Status: DC | PRN

## 2014-01-05 MED ORDER — PREDNISONE 20 MG PO TABS
ORAL_TABLET | ORAL | Status: AC
  Filled 2014-01-05: qty 3

## 2014-01-05 MED ORDER — OMEPRAZOLE 20 MG PO CPDR: 20.0000 mg | DELAYED_RELEASE_CAPSULE | Freq: Every day | ORAL | Status: DC

## 2014-01-05 MED ORDER — ALBUTEROL SULFATE HFA 108 (90 BASE) MCG/ACT IN AERS: 1.0000 | INHALATION_SPRAY | Freq: Four times a day (QID) | RESPIRATORY_TRACT | Status: DC | PRN

## 2014-01-05 MED ORDER — PREDNISONE 20 MG PO TABS
60.0000 mg | ORAL_TABLET | Freq: Once | ORAL | Status: AC
  Administered 2014-01-05: 60 mg via ORAL

## 2014-01-05 NOTE — ED Notes (Signed)
Pt states that she is having back pain and cough over the past few days she states back pain is lower mid back. Pt is in no acute distress at this time VSS.

## 2014-01-05 NOTE — ED Provider Notes (Signed)
CSN: [id number]     Arrival date & time 01/05/14  1517 History   First MD Initiated Contact with Patient 01/05/14 0912     Chief Complaint  Patient presents with  . Cough  . Back Pain   (Consider location/radiation/quality/duration/timing/severity/associated sxs/prior Treatment) HPI Comments: PCP: none unemployed  Patient is a 23 y.o. female presenting with cough and back pain. The history is provided by the patient.  Cough Cough characteristics:  Non-productive Severity:  Moderate Onset quality:  Gradual Duration: +several weeks. Timing: states symptoms primarily occur and mornings and evenings. Chronicity:  Chronic Associated symptoms: no chest pain, no chills, no diaphoresis, no fever, no rash, no rhinorrhea, no shortness of breath and no weight loss   Associated symptoms comment:  No hemoptysis Risk factors comment:  +smoker (1PPD) Back Pain Associated symptoms: no chest pain, no fever and no weight loss     History reviewed. No pertinent past medical history. History reviewed. No pertinent past surgical history. Family History  Problem Relation Age of Onset  . Diabetes Father    History  Substance Use Topics  . Smoking status: Current Every Day Smoker -- 0.50 packs/day    Types: Cigarettes  . Smokeless tobacco: Not on file  . Alcohol Use: No   OB History   Grav Para Term Preterm Abortions TAB SAB Ect Mult Living                 Review of Systems  Constitutional: Negative for fever, chills, weight loss and diaphoresis.  HENT: Negative for rhinorrhea.   Respiratory: Positive for cough. Negative for shortness of breath.   Cardiovascular: Negative for chest pain.  Musculoskeletal: Positive for back pain.  Skin: Negative for rash.  All other systems reviewed and are negative.   Allergies  Review of patient's allergies indicates no known allergies.  Home Medications   Prior to Admission medications   Medication Sig Start Date End Date Taking? Authorizing  Provider  albuterol (PROVENTIL HFA;VENTOLIN HFA) 108 (90 BASE) MCG/ACT inhaler Inhale 1-2 puffs into the lungs every 6 (six) hours as needed for wheezing or shortness of breath. 01/05/14   Lutricia Feil, PA  azithromycin (ZITHROMAX) 250 MG tablet Take all 4 pills as one dose, one time 04/03/13   Billy Fischer, MD  azithromycin (ZITHROMAX) 500 MG tablet Take 2 tablets at one time. 04/04/13   Harden Mo, MD  benzonatate (TESSALON) 100 MG capsule Take 1 capsule (100 mg total) by mouth 3 (three) times daily as needed for cough. 01/05/14   Audelia Hives Kaylynn Chamblin, PA  diclofenac (CATAFLAM) 50 MG tablet Take 1 tablet (50 mg total) by mouth 3 (three) times daily. Prn chest wall pain 09/09/13   Billy Fischer, MD  diphenhydrAMINE-zinc acetate (BENADRYL) cream Apply topically 3 (three) times daily as needed for itching. 08/02/13   Audelia Hives Alyda Megna, PA  doxycycline (VIBRAMYCIN) 100 MG capsule Take 1 capsule (100 mg total) by mouth 2 (two) times daily. X 7 days 08/02/13   Audelia Hives Jelisha Weed, PA  esomeprazole (NEXIUM) 40 MG capsule Take 1 capsule (40 mg total) by mouth daily. 03/29/13   Billy Fischer, MD  ketotifen (ZADITOR) 0.025 % ophthalmic solution Place 1 drop into both eyes 2 (two) times daily. 11/07/12   Janne Napoleon, NP  methylPREDNISolone (MEDROL DOSEPAK) 4 MG tablet follow package directions. Take with food 11/07/12   Janne Napoleon, NP  Norethindrone-Ethinyl Estradiol-Fe Biphas (LO LOESTRIN FE) 1 MG-10 MCG / 10  MCG tablet Take 1 tablet by mouth daily. 12/02/12   Lahoma Crocker, MD  omeprazole (PRILOSEC) 20 MG capsule Take 1 capsule (20 mg total) by mouth daily. 01/05/14   Audelia Hives Keshana Klemz, PA  omeprazole (PRILOSEC) 40 MG capsule Take 1 capsule (40 mg total) by mouth daily. 03/30/13   Billy Fischer, MD  ondansetron (ZOFRAN) 4 MG tablet Take 1 tablet (4 mg total) by mouth every 6 (six) hours. Prn n/v 03/30/13   Billy Fischer, MD  Phenylephrine-Chlorphen-DM 01-10-11.5 MG/5ML LIQD Take 5 mLs by  mouth every 4 (four) hours as needed. May cause drowsiness 11/07/12   Janne Napoleon, NP   BP 117/78  Pulse 82  Temp(Src) 98.9 F (37.2 C) (Oral)  Resp 16  SpO2 99%  LMP 12/27/2013 Physical Exam  Nursing note and vitals reviewed. Constitutional: She is oriented to person, place, and time. She appears well-developed and well-nourished. No distress.  HENT:  Head: Normocephalic and atraumatic.  Right Ear: Hearing, tympanic membrane, external ear and ear canal normal.  Left Ear: Hearing, tympanic membrane, external ear and ear canal normal.  Nose: Nose normal.  Mouth/Throat: Uvula is midline, oropharynx is clear and moist and mucous membranes are normal.  Eyes: Conjunctivae are normal. No scleral icterus.  Neck: Normal range of motion. Neck supple.  Cardiovascular: Normal rate, regular rhythm and normal heart sounds.   Pulmonary/Chest: No respiratory distress. She has wheezes.  +moderate end expiratory wheezes  Abdominal: Soft. Bowel sounds are normal. She exhibits no distension. There is no tenderness.  Musculoskeletal: Normal range of motion.  Lymphadenopathy:    She has no cervical adenopathy.  Neurological: She is alert and oriented to person, place, and time.  Skin: Skin is warm and dry. No rash noted. No erythema.  Psychiatric: She has a normal mood and affect. Her behavior is normal.    ED Course  Procedures (including critical care time) Labs Review Labs Reviewed - No data to display  Imaging Review No results found.   MDM   1. Simple chronic bronchitis   2. Gastroesophageal reflux disease without esophagitis    Hx and exam suggests chronic bronchitis as a result of smoking. Patient also mentions that she has had some GERD sx and this could certainly be contributing to her chronic cough. Will treat with bronchodilators, tessalon perles and prilosec. Strongly encouraged to discontinue smoking and to locate a PCP of her choice for follow up should symptoms persist.      Lutricia Feil, PA 01/05/14 1029

## 2014-01-05 NOTE — Discharge Instructions (Signed)
Cough, Adult  A cough is a reflex that helps clear your throat and airways. It can help heal the body or may be a reaction to an irritated airway. A cough may only last 2 or 3 weeks (acute) or may last more than 8 weeks (chronic).  CAUSES Acute cough:  Viral or bacterial infections. Chronic cough:  Infections.  Allergies.  Asthma.  Post-nasal drip.  Smoking.  Heartburn or acid reflux.  Some medicines.  Chronic lung problems (COPD).  Cancer. SYMPTOMS   Cough.  Fever.  Chest pain.  Increased breathing rate.  High-pitched whistling sound when breathing (wheezing).  Colored mucus that you cough up (sputum). TREATMENT   A bacterial cough may be treated with antibiotic medicine.  A viral cough must run its course and will not respond to antibiotics.  Your caregiver may recommend other treatments if you have a chronic cough. HOME CARE INSTRUCTIONS   Only take over-the-counter or prescription medicines for pain, discomfort, or fever as directed by your caregiver. Use cough suppressants only as directed by your caregiver.  Use a cold steam vaporizer or humidifier in your bedroom or home to help loosen secretions.  Sleep in a semi-upright position if your cough is worse at night.  Rest as needed.  Stop smoking if you smoke. SEEK IMMEDIATE MEDICAL CARE IF:   You have pus in your sputum.  Your cough starts to worsen.  You cannot control your cough with suppressants and are losing sleep.  You begin coughing up blood.  You have difficulty breathing.  You develop pain which is getting worse or is uncontrolled with medicine.  You have a fever. MAKE SURE YOU:   Understand these instructions.  Will watch your condition.  Will get help right away if you are not doing well or get worse. Document Released: 09/21/2010 Document Revised: 06/17/2011 Document Reviewed: 09/21/2010 Exit[employer] Patient Information 2015 [employer]. This information is not intended  to replace advice given to you by your health care provider. Make sure you discuss any questions you have with your health care provider. Gastroesophageal Reflux Disease, Adult Gastroesophageal reflux disease (GERD) happens when acid from your stomach flows up into the esophagus. When acid comes in contact with the esophagus, the acid causes soreness (inflammation) in the esophagus. Over time, GERD may create small holes (ulcers) in the lining of the esophagus. CAUSES   Increased body weight. This puts pressure on the stomach, making acid rise from the stomach into the esophagus.  Smoking. This increases acid production in the stomach.  Drinking alcohol. This causes decreased pressure in the lower esophageal sphincter (valve or ring of muscle between the esophagus and stomach), allowing acid from the stomach into the esophagus.  Late evening meals and a full stomach. This increases pressure and acid production in the stomach.  A malformed lower esophageal sphincter. Sometimes, no cause is found. SYMPTOMS   Burning pain in the lower part of the mid-chest behind the breastbone and in the mid-stomach area. This may occur twice a week or more often.  Trouble swallowing.  Sore throat.  Dry cough.  Asthma-like symptoms including chest tightness, shortness of breath, or wheezing. DIAGNOSIS  Your caregiver may be able to diagnose GERD based on your symptoms. In some cases, X-rays and other tests may be done to check for complications or to check the condition of your stomach and esophagus. TREATMENT  Your caregiver may recommend over-the-counter or prescription medicines to help decrease acid production. Ask your caregiver before starting or   adding any new medicines.  HOME CARE INSTRUCTIONS   Change the factors that you can control. Ask your caregiver for guidance concerning weight loss, quitting smoking, and alcohol consumption.  Avoid foods and drinks that make your symptoms worse, such  as:  Caffeine or alcoholic drinks.  Chocolate.  Peppermint or mint flavorings.  Garlic and onions.  Spicy foods.  Citrus fruits, such as oranges, lemons, or limes.  Tomato-based foods such as sauce, chili, salsa, and pizza.  Fried and fatty foods.  Avoid lying down for the 3 hours prior to your bedtime or prior to taking a nap.  Eat small, frequent meals instead of large meals.  Wear loose-fitting clothing. Do not wear anything tight around your waist that causes pressure on your stomach.  Raise the head of your bed 6 to 8 inches with wood blocks to help you sleep. Extra pillows will not help.  Only take over-the-counter or prescription medicines for pain, discomfort, or fever as directed by your caregiver.  Do not take aspirin, ibuprofen, or other nonsteroidal anti-inflammatory drugs (NSAIDs). SEEK IMMEDIATE MEDICAL CARE IF:   You have pain in your arms, neck, jaw, teeth, or back.  Your pain increases or changes in intensity or duration.  You develop nausea, vomiting, or sweating (diaphoresis).  You develop shortness of breath, or you faint.  Your vomit is green, yellow, black, or looks like coffee grounds or blood.  Your stool is red, bloody, or black. These symptoms could be signs of other problems, such as heart disease, gastric bleeding, or esophageal bleeding. MAKE SURE YOU:   Understand these instructions.  Will watch your condition.  Will get help right away if you are not doing well or get worse. Document Released: 01/02/2005 Document Revised: 06/17/2011 Document Reviewed: 10/12/2010 ExitCare Patient Information 2015 [employer]. This information is not intended to replace advice given to you by your health care provider. Make sure you discuss any questions you have with your health care provider. Food Choices for Gastroesophageal Reflux Disease When you have gastroesophageal reflux disease (GERD), the foods you eat and your eating habits are very  important. Choosing the right foods can help ease the discomfort of GERD. WHAT GENERAL GUIDELINES DO I NEED TO FOLLOW?  Choose fruits, vegetables, whole grains, low-fat dairy products, and low-fat meat, fish, and poultry.  Limit fats such as oils, salad dressings, butter, nuts, and avocado.  Keep a food diary to identify foods that cause symptoms.  Avoid foods that cause reflux. These may be different for different people.  Eat frequent small meals instead of three large meals each day.  Eat your meals slowly, in a relaxed setting.  Limit fried foods.  Cook foods using methods other than frying.  Avoid drinking alcohol.  Avoid drinking large amounts of liquids with your meals.  Avoid bending over or lying down until 2-3 hours after eating. WHAT FOODS ARE NOT RECOMMENDED? The following are some foods and drinks that may worsen your symptoms: Vegetables Tomatoes. Tomato juice. Tomato and spaghetti sauce. Chili peppers. Onion and garlic. Horseradish. Fruits Oranges, grapefruit, and lemon (fruit and juice). Meats High-fat meats, fish, and poultry. This includes hot dogs, ribs, ham, sausage, salami, and bacon. Dairy Whole milk and chocolate milk. Sour cream. Cream. Butter. Ice cream. Cream cheese.  Beverages Coffee and tea, with or without caffeine. Carbonated beverages or energy drinks. Condiments Hot sauce. Barbecue sauce.  Sweets/Desserts Chocolate and cocoa. Donuts. Peppermint and spearmint. Fats and Oils High-fat foods, including French fries and potato   chips. Other Vinegar. Strong spices, such as black pepper, white pepper, red pepper, cayenne, curry powder, cloves, ginger, and chili powder. The items listed above may not be a complete list of foods and beverages to avoid. Contact your dietitian for more information. Document Released: 03/25/2005 Document Revised: 03/30/2013 Document Reviewed: 01/27/2013 ExitCare Patient Information 2015 [employer]. This  information is not intended to replace advice given to you by your health care provider. Make sure you discuss any questions you have with your health care provider.  

## 2014-01-05 NOTE — ED Provider Notes (Signed)
Medical screening examination/treatment/procedure(s) were performed by non-physician practitioner and as supervising physician I was immediately available for consultation/collaboration.  Philipp Deputy, M.D.  Harden Mo, MD 01/05/14 [phone]

## 2014-02-05 ENCOUNTER — Encounter (HOSPITAL_COMMUNITY): Payer: Self-pay | Admitting: Emergency Medicine

## 2014-02-05 ENCOUNTER — Emergency Department (HOSPITAL_COMMUNITY)
Admission: EM | Admit: 2014-02-05 | Discharge: 2014-02-05 | Disposition: A | Discharge status: Home / Self Care | Payer: Medicaid Other | Attending: Emergency Medicine | Admitting: Emergency Medicine

## 2014-02-05 ENCOUNTER — Emergency Department (HOSPITAL_COMMUNITY): Payer: Medicaid Other

## 2014-02-05 DIAGNOSIS — R079 Chest pain, unspecified: Secondary | ICD-10-CM

## 2014-02-05 DIAGNOSIS — J40 Bronchitis, not specified as acute or chronic: Secondary | ICD-10-CM | POA: Diagnosis not present

## 2014-02-05 DIAGNOSIS — Z720 Tobacco use: Secondary | ICD-10-CM | POA: Diagnosis not present

## 2014-02-05 DIAGNOSIS — Z3202 Encounter for pregnancy test, result negative: Secondary | ICD-10-CM | POA: Diagnosis not present

## 2014-02-05 DIAGNOSIS — Z79899 Other long term (current) drug therapy: Secondary | ICD-10-CM | POA: Insufficient documentation

## 2014-02-05 LAB — I-STAT CHEM 8, ED
BUN: 9 mg/dL (ref 6–23)
CHLORIDE: 103 meq/L (ref 96–112)
CREATININE: 0.6 mg/dL (ref 0.50–1.10)
Calcium, Ion: 1.09 mmol/L — ABNORMAL LOW (ref 1.12–1.23)
GLUCOSE: 99 mg/dL (ref 70–99)
HEMATOCRIT: 49 % — AB (ref 36.0–46.0)
Hemoglobin: 16.7 g/dL — ABNORMAL HIGH (ref 12.0–15.0)
Potassium: 4.5 mEq/L (ref 3.7–5.3)
Sodium: 138 mEq/L (ref 137–147)
TCO2: 24 mmol/L (ref 0–100)

## 2014-02-05 LAB — POC URINE PREG, ED: PREG TEST UR: NEGATIVE

## 2014-02-05 LAB — D-DIMER, QUANTITATIVE: D-Dimer, Quant: 0.45 ug/mL-FEU (ref 0.00–0.48)

## 2014-02-05 MED ORDER — KETOROLAC TROMETHAMINE 15 MG/ML IJ SOLN
15.0000 mg | Freq: Once | INTRAMUSCULAR | Status: AC
  Administered 2014-02-05: 15 mg via INTRAVENOUS
  Filled 2014-02-05: qty 1

## 2014-02-05 MED ORDER — SODIUM CHLORIDE 0.9 % IV BOLUS (SEPSIS)
500.0000 mL | Freq: Once | INTRAVENOUS | Status: AC
  Administered 2014-02-05: 500 mL via INTRAVENOUS

## 2014-02-05 MED ORDER — NAPROXEN 375 MG PO TABS: 375.0000 mg | ORAL_TABLET | Freq: Two times a day (BID) | ORAL | Status: DC

## 2014-02-05 MED ORDER — ACETAMINOPHEN 325 MG PO TABS
650.0000 mg | ORAL_TABLET | Freq: Once | ORAL | Status: AC
  Administered 2014-02-05: 650 mg via ORAL
  Filled 2014-02-05: qty 2

## 2014-02-05 NOTE — ED Notes (Signed)
Dr. Zavits at bedside   

## 2014-02-05 NOTE — ED Notes (Signed)
Chest pain right side, hurts to touch, hurts to move.  Going on for a week constant, patient states she was seen at urgent care and diagnosed with bad bronchitis a week ago.  Denies fever, chills.  Has cough that is mostly non productive, hurts to cough.  Denies recent swelling on legs, surgeries, long travels.  States she coughed up a little blood tinged mucus a few days ago.  Denies heart problems, denies blood clots history, does smoke.  History of gerd, bronchitis

## 2014-02-05 NOTE — ED Notes (Signed)
Pt. Stated, I've had right side chest pain for a week constant. Denies any injury or exercise.

## 2014-02-05 NOTE — Discharge Instructions (Signed)
If you were given medicines take as directed.  If you are on coumadin or contraceptives realize their levels and effectiveness is altered by many different medicines.  If you have any reaction (rash, tongues swelling, other) to the medicines stop taking and see a physician.   Please follow up as directed and return to the ER or see a physician for new or worsening symptoms.  Thank you. Filed Vitals:   02/05/14 0900 02/05/14 0936 02/05/14 1015 02/05/14 1030  BP: 118/72 114/77 109/79 117/72  Pulse: 94  86 86  Temp:      TempSrc:      Resp: 22 16    SpO2: 99% 98% 99% 99%    Chest Wall Pain Chest wall pain is pain in or around the bones and muscles of your chest. It may take up to 6 weeks to get better. It may take longer if you must stay physically active in your work and activities.  CAUSES  Chest wall pain may happen on its own. However, it may be caused by:  A viral illness like the flu.  Injury.  Coughing.  Exercise.  Arthritis.  Fibromyalgia.  Shingles. HOME CARE INSTRUCTIONS   Avoid overtiring physical activity. Try not to strain or perform activities that cause pain. This includes any activities using your chest or your abdominal and side muscles, especially if heavy weights are used.  Put ice on the sore area.  Put ice in a plastic bag.  Place a towel between your skin and the bag.  Leave the ice on for 15-20 minutes per hour while awake for the first 2 days.  Only take over-the-counter or prescription medicines for pain, discomfort, or fever as directed by your caregiver. SEEK IMMEDIATE MEDICAL CARE IF:   Your pain increases, or you are very uncomfortable.  You have a fever.  Your chest pain becomes worse.  You have new, unexplained symptoms.  You have nausea or vomiting.  You feel sweaty or lightheaded.  You have a cough with phlegm (sputum), or you cough up blood. MAKE SURE YOU:   Understand these instructions.  Will watch your condition.  Will  get help right away if you are not doing well or get worse. Document Released: 03/25/2005 Document Revised: 06/17/2011 Document Reviewed: 11/19/2010 [facility] Patient Information 2015 [place], [place]. This information is not intended to replace advice given to you by your health care provider. Make sure you discuss any questions you have with your health care provider.

## 2014-02-05 NOTE — ED Provider Notes (Signed)
CSN: [id number]     Arrival date & time 02/05/14  0803 History   First MD Initiated Contact with Patient 02/05/14 [phone]     Chief Complaint  Patient presents with  . Chest Pain     (Consider location/radiation/quality/duration/timing/severity/associated sxs/prior Treatment) HPI Comments: 23 year old female with smoking history, reflux bronchitis presents with right-sided anterior chest pain worse with coughing and palpation. No shortness of breath. Patient denies any injury or exertional symptoms. Patient has no blood clot history, cardiac history, recent surgeries, long travel, birth control. Patient recently had bronchitis and still has cough. No diaphoresis or fevers. No unilateral leg symptoms.  Patient is a 23 y.o. female presenting with chest pain. The history is provided by the patient.  Chest Pain Associated symptoms: no abdominal pain, no back pain, no fever, no headache, no shortness of breath and not vomiting     History reviewed. No pertinent past medical history. History reviewed. No pertinent past surgical history. Family History  Problem Relation Age of Onset  . Diabetes Father    History  Substance Use Topics  . Smoking status: Current Every Day Smoker -- 0.50 packs/day    Types: Cigarettes  . Smokeless tobacco: Not on file  . Alcohol Use: No   OB History   Grav Para Term Preterm Abortions TAB SAB Ect Mult Living                 Review of Systems  Constitutional: Negative for fever and chills.  HENT: Negative for congestion.   Eyes: Negative for visual disturbance.  Respiratory: Negative for shortness of breath.   Cardiovascular: Positive for chest pain.  Gastrointestinal: Negative for vomiting and abdominal pain.  Genitourinary: Negative for dysuria and flank pain.  Musculoskeletal: Negative for back pain, neck pain and neck stiffness.  Skin: Negative for rash.  Neurological: Negative for light-headedness and headaches.      Allergies  Review of  patient's allergies indicates no known allergies.  Home Medications   Prior to Admission medications   Medication Sig Start Date End Date Taking? Authorizing Provider  albuterol (PROVENTIL HFA;VENTOLIN HFA) 108 (90 BASE) MCG/ACT inhaler Inhale 1-2 puffs into the lungs every 6 (six) hours as needed for wheezing or shortness of breath. 01/05/14  Yes Audelia Hives Presson, PA  Camphor-Eucalyptus-Menthol (VICKS VAPORUB EX) Apply 1 application topically as needed (for chest pain).   Yes Historical Provider, MD  omeprazole (PRILOSEC) 20 MG capsule Take 1 capsule (20 mg total) by mouth daily. 01/05/14  Yes Audelia Hives Presson, PA  naproxen (NAPROSYN) 375 MG tablet Take 1 tablet (375 mg total) by mouth 2 (two) times daily. 02/05/14   Mariea Clonts, MD   BP 117/72  Pulse 86  Temp(Src) 98.2 F (36.8 C) (Oral)  Resp 16  SpO2 99%  LMP 01/22/2014 Physical Exam  Nursing note and vitals reviewed. Constitutional: She is oriented to person, place, and time. She appears well-developed and well-nourished.  HENT:  Head: Normocephalic and atraumatic.  Eyes: Conjunctivae are normal. Right eye exhibits no discharge. Left eye exhibits no discharge.  Neck: Normal range of motion. Neck supple. No tracheal deviation present.  Cardiovascular: Normal rate and regular rhythm.   No murmur heard. Pulmonary/Chest: Effort normal and breath sounds normal.  Abdominal: Soft. She exhibits no distension. There is no tenderness. There is no guarding.  Musculoskeletal: She exhibits tenderness. She exhibits no edema.  Patient has moderate tenderness right anterior ribs and musculature, no cellulitis or new rash.  Neurological: She  is alert and oriented to person, place, and time.  Skin: Skin is warm. No rash noted.  Psychiatric: She has a normal mood and affect.    ED Course  Procedures (including critical care time) Labs Review Labs Reviewed  I-STAT CHEM 8, ED - Abnormal; Notable for the following:    Calcium,  Ion 1.09 (*)    Hemoglobin 16.7 (*)    HCT 49.0 (*)    All other components within normal limits  D-DIMER, QUANTITATIVE  POC URINE PREG, ED    Imaging Review Dg Chest 2 View  02/05/2014   CLINICAL DATA:  Chest pain 1 week  EXAM: CHEST  2 VIEW  COMPARISON:  07/22/2012  FINDINGS: Bibasilar atelectasis. Normal heart size. No pneumothorax. No pleural effusion.  IMPRESSION: Bibasilar atelectasis.   Electronically Signed   By: Maryclare Bean M.D.   On: 02/05/2014 09:26     EKG Interpretation   Date/Time:  Saturday February 05 2014 08:07:44 EDT Ventricular Rate:  101 PR Interval:  116 QRS Duration: 82 QT Interval:  344 QTC Calculation: 446 R Axis:   78 Text Interpretation:  Sinus tachycardia Otherwise normal ECG Confirmed by  Harlan Vinal  MD, Yussef Jorge ([id number]) on 02/05/2014 9:12:13 AM      MDM   Final diagnoses:  Chest pain  Bronchitis   patient with right sided chest pain most consistent with musculoskeletal, 100% reproducible on palpation. Discussed differential including pleuritis with current bronchitis symptoms, pneumonia, musculoskeletal, pneumothorax. I feel unlikely pe. Patient well-appearing otherwise. Pain medicines, chest x-ray, EKG reviewed.  Chest x-ray reviewed no acute findings. Patient improved in the ER, patient low risk blood clot, d-dimer ordered, negative. Follow-up outpatient discussed. Likely musculoskeletal.  Results and differential diagnosis were discussed with the patient/parent/guardian. Close follow up outpatient was discussed, comfortable with the plan.   Medications  acetaminophen (TYLENOL) tablet 650 mg (650 mg Oral Given 02/05/14 0946)  sodium chloride 0.9 % bolus 500 mL (0 mLs Intravenous Stopped 02/05/14 1032)  ketorolac (TORADOL) 15 MG/ML injection 15 mg (15 mg Intravenous Given 02/05/14 1040)    Filed Vitals:   02/05/14 0900 02/05/14 0936 02/05/14 1015 02/05/14 1030  BP: 118/72 114/77 109/79 117/72  Pulse: 94  86 86  Temp:      TempSrc:      Resp:  22 16    SpO2: 99% 98% 99% 99%    Final diagnoses:  Chest pain  Bronchitis        Mariea Clonts, MD 02/05/14 1051

## 2014-03-18 ENCOUNTER — Encounter (HOSPITAL_BASED_OUTPATIENT_CLINIC_OR_DEPARTMENT_OTHER): Payer: Self-pay

## 2014-03-18 ENCOUNTER — Emergency Department (HOSPITAL_BASED_OUTPATIENT_CLINIC_OR_DEPARTMENT_OTHER)
Admission: EM | Admit: 2014-03-18 | Discharge: 2014-03-18 | Disposition: A | Discharge status: Home / Self Care | Payer: Medicaid Other | Attending: Emergency Medicine | Admitting: Emergency Medicine

## 2014-03-18 ENCOUNTER — Emergency Department (HOSPITAL_BASED_OUTPATIENT_CLINIC_OR_DEPARTMENT_OTHER): Payer: Medicaid Other

## 2014-03-18 DIAGNOSIS — R059 Cough, unspecified: Secondary | ICD-10-CM

## 2014-03-18 DIAGNOSIS — H9203 Otalgia, bilateral: Secondary | ICD-10-CM | POA: Diagnosis not present

## 2014-03-18 DIAGNOSIS — J069 Acute upper respiratory infection, unspecified: Secondary | ICD-10-CM | POA: Diagnosis not present

## 2014-03-18 DIAGNOSIS — Z720 Tobacco use: Secondary | ICD-10-CM | POA: Insufficient documentation

## 2014-03-18 DIAGNOSIS — R05 Cough: Secondary | ICD-10-CM

## 2014-03-18 MED ORDER — LORATADINE-PSEUDOEPHEDRINE ER 5-120 MG PO TB12: 1.0000 | ORAL_TABLET | Freq: Two times a day (BID) | ORAL | Status: DC

## 2014-03-18 MED ORDER — DEXTROMETHORPHAN POLISTIREX 30 MG/5ML PO LQCR: 30.0000 mg | ORAL | Status: DC | PRN

## 2014-03-18 NOTE — Discharge Instructions (Signed)
Take Claritin-D in the morning for congestion. Take delsym as needed for cough. Refer to attached documents for more information.

## 2014-03-18 NOTE — ED Notes (Signed)
C/o dry cough, bilat ear ache x 3 days

## 2014-03-18 NOTE — ED Provider Notes (Signed)
CSN: [id number]     Arrival date & time 03/18/14  1140 History   First MD Initiated Contact with Patient 03/18/14 1217     Chief Complaint  Patient presents with  . Cough     (Consider location/radiation/quality/duration/timing/severity/associated sxs/prior Treatment) Patient is a 23 y.o. female presenting with cough. The history is provided by the patient. No language interpreter was used.  Cough Cough characteristics:  Non-productive and hacking Severity:  Moderate Onset quality:  Gradual Duration:  3 days Timing:  Constant Progression:  Unchanged Chronicity:  New Smoker: yes   Context: not animal exposure, not exposure to allergens, not fumes, not occupational exposure, not sick contacts, not smoke exposure, not upper respiratory infection, not weather changes and not with activity   Relieved by:  Nothing Worsened by:  Nothing tried Ineffective treatments:  None tried Associated symptoms: ear pain   Associated symptoms: no chest pain, no chills, no diaphoresis, no fever, no rash, no shortness of breath, no sinus congestion and no weight loss   Ear pain:    Location:  Bilateral   Severity:  Moderate   Onset quality:  Gradual   Duration:  3 days   Timing:  Constant   Progression:  Unchanged   Chronicity:  New Risk factors: no chemical exposure, no recent infection and no recent travel     History reviewed. No pertinent past medical history. History reviewed. No pertinent past surgical history. Family History  Problem Relation Age of Onset  . Diabetes Father    History  Substance Use Topics  . Smoking status: Current Every Day Smoker -- 0.50 packs/day    Types: Cigarettes  . Smokeless tobacco: Not on file  . Alcohol Use: No   OB History    No data available     Review of Systems  Constitutional: Negative for fever, chills, weight loss and diaphoresis.  HENT: Positive for ear pain.   Respiratory: Positive for cough. Negative for shortness of breath.    Cardiovascular: Negative for chest pain.  Skin: Negative for rash.  All other systems reviewed and are negative.     Allergies  Review of patient's allergies indicates no known allergies.  Home Medications   Prior to Admission medications   Not on File   BP 122/85 mmHg  Pulse 97  Temp(Src) 98.5 F (36.9 C) (Oral)  Resp 18  Ht 5' (1.524 m)  Wt 134 lb 8 oz (61.009 kg)  BMI 26.27 kg/m2  SpO2 100%  LMP 02/17/2014 Physical Exam  Constitutional: She is oriented to person, place, and time. She appears well-developed and well-nourished. No distress.  HENT:  Head: Normocephalic and atraumatic.  Right Ear: External ear normal.  Left Ear: External ear normal.  Mouth/Throat: Oropharynx is clear and moist. No oropharyngeal exudate.  Bilateral TM intact and without erythema or bulging.   Eyes: Conjunctivae and EOM are normal.  Neck: Normal range of motion.  Cardiovascular: Normal rate and regular rhythm.  Exam reveals no gallop and no friction rub.   No murmur heard. Pulmonary/Chest: Effort normal and breath sounds normal. She has no wheezes. She has no rales. She exhibits no tenderness.  Abdominal: Soft. She exhibits no distension. There is no tenderness. There is no rebound.  Musculoskeletal: Normal range of motion.  Lymphadenopathy:    She has no cervical adenopathy.  Neurological: She is alert and oriented to person, place, and time. Coordination normal.  Speech is goal-oriented. Moves limbs without ataxia.   Skin: Skin is warm and dry.  Psychiatric: She has a normal mood and affect. Her behavior is normal.  Nursing note and vitals reviewed.   ED Course  Procedures (including critical care time) Labs Review Labs Reviewed - No data to display  Imaging Review Dg Chest 2 View  03/18/2014   CLINICAL DATA:  Cough for 3 days.  Smoking history.  EXAM: CHEST  2 VIEW  COMPARISON:  Two-view chest x-ray 02/05/2014  FINDINGS: The heart size is normal. Linear atelectasis or  scarring in the lingula is stable. No new airspace consolidation is present.  IMPRESSION: No acute cardiopulmonary disease or significant interval change.   Electronically Signed   By: Lawrence Santiago M.D.   On: 03/18/2014 12:58     EKG Interpretation None      MDM   Final diagnoses:  Cough  URI (upper respiratory infection)    1:29 PM Chest xray unremarkable for acute changes. Patient is PERC negative. Patient likely has URI and will be treated with Claritin-D and delsym. No further evaluation needed at this time. Vitals stable and patient afebrile.     Alvina Chou, PA-C 03/18/14 [place], MD 03/18/14 1622

## 2014-03-28 ENCOUNTER — Ambulatory Visit (INDEPENDENT_AMBULATORY_CARE_PROVIDER_SITE_OTHER): Payer: Medicaid Other | Admitting: Family Medicine

## 2014-03-28 ENCOUNTER — Ambulatory Visit (INDEPENDENT_AMBULATORY_CARE_PROVIDER_SITE_OTHER): Payer: Medicaid Other | Admitting: *Deleted

## 2014-03-28 VITALS — BP 124/76 | HR 94 | Temp 97.9°F | Ht 60.0 in | Wt 133.3 lb

## 2014-03-28 DIAGNOSIS — Z7189 Other specified counseling: Secondary | ICD-10-CM

## 2014-03-28 DIAGNOSIS — R635 Abnormal weight gain: Secondary | ICD-10-CM

## 2014-03-28 DIAGNOSIS — Z23 Encounter for immunization: Secondary | ICD-10-CM

## 2014-03-28 DIAGNOSIS — Z8349 Family history of other endocrine, nutritional and metabolic diseases: Secondary | ICD-10-CM

## 2014-03-28 DIAGNOSIS — Z7689 Persons encountering health services in other specified circumstances: Secondary | ICD-10-CM

## 2014-03-28 DIAGNOSIS — Z113 Encounter for screening for infections with a predominantly sexual mode of transmission: Secondary | ICD-10-CM

## 2014-03-28 LAB — LIPID PANEL
CHOL/HDL RATIO: 3.7 ratio
Cholesterol: 138 mg/dL (ref 0–200)
HDL: 37 mg/dL — AB (ref >39)
LDL CALC: 54 mg/dL (ref 0–99)
TRIGLYCERIDES: 234 mg/dL — AB (ref <150)
VLDL: 47 mg/dL — AB (ref 0–40)

## 2014-03-28 NOTE — Patient Instructions (Signed)
Thank you so much for coming to visit [place] today! It is a pleasure having you with [place] at our clinic!  We will check your thyroid and cholesterol levels today. We will contact you if these levels are abnormal.  Your blood sugar looks great from labs earlier this year!  It looks like you are due for a Pap Smear. Please schedule an appointment for this to be done and to further discuss some of your concerns!  Thanks again! Dr. Gerlean Ren

## 2014-03-28 NOTE — Progress Notes (Signed)
Subjective:     Patient ID: Shannon Wolfe, female   DOB: 07-29-1990, 23 y.o.   MRN: [id number]  HPI Mrs. Shannon Wolfe) is a 23yo female presenting today to establish care. No previous primary physician to obtain records from. No concerns today.  - Never officially diagnosed with Asthma. States she has been given two inhalers in past, one with a "perfume" taste and one without taste. She is unsure what the inhalers were. States she used them as needed, which ended up being about once a day. Does not currently have an inhaler and has not needed one for a few months. - Has chronic abdominal pain located in epigastric and RLQ and LLQ. LQ pain occurs multiple times throughout the day every day. States pain first started when she had Mirena placed and it has remained despite having Mirena removed 3 years ago. - Has periodic migraines. States light hurts her eyes during these episodes and pain is located in corner of eyes. - Has family history of hypothyroidism and she feels like she has been gaining weight. She would like her thyroid checked today.  PMH: questionable Asthma, Chronic Pain (back, abdomen), GERD, questionable Migraines Sx: None Meds: None Ax: None  Social Hx: Married. Unemployed but looking for job. Lives with 35yo son Shannon Wolfe) and 7yo husband. Married in July and in process of changing last name to Shannon Wolfe. Does not exercise regularly. Has been smoking since 23yo and recently cut back from 2ppd to 1/2ppd. 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 Denies alcohol use.   Fam Hx: Heart Disease (Grandparents, Aunts/Uncles), Diabetes (Father, Grandparents, Aunts/Uncles), HTN (Father), Hypothyroidism (Mother) - Mom adopted and her maternal family history is unknown  Health Maintenance- Believes last pap smear was 2-3 years ago, but she is unsure.  Review of Systems  Constitutional: Positive for fatigue and unexpected weight change. Negative for  activity change.  Gastrointestinal: Positive for abdominal pain. Negative for constipation.  Genitourinary: Positive for pelvic pain. Negative for flank pain.  Musculoskeletal: Positive for back pain.  Neurological: Positive for headaches.       Objective:   Physical Exam  Constitutional: She is oriented to person, place, and time. She appears well-developed and well-nourished. No distress.  HENT:  Head: Normocephalic and atraumatic.  Cardiovascular: Normal rate and regular rhythm.  Exam reveals no gallop and no friction rub.   No murmur heard. Pulmonary/Chest: Effort normal. No respiratory distress. She has no wheezes. She has no rales.  Abdominal: Soft. Bowel sounds are normal. She exhibits no distension. There is no tenderness. There is no rebound.  Musculoskeletal: She exhibits no edema.  Neurological: She is alert and oriented to person, place, and time.  Skin: Skin is warm and dry. No rash noted.  Psychiatric: She has a normal mood and affect. Her behavior is normal.      Assessment:     Please refer to Problem List for Assessment.     Plan:     Please refer to Problem List for Plan.

## 2014-03-28 NOTE — Assessment & Plan Note (Addendum)
-   Smoking Cessation discussed - TSH, Lipid Panel, and HIV today. Will contact Mrs. Doke with any abnormal results. - Will receive Gardisil vaccine and Flu vaccine - Will schedule an appointment for pap smear. Plan to test for gonorrhea/chlamydia at next visit as well. - Will schedule appointment to discuss some of her complaints in further detail, including abdominal pain, migraines, and possible asthma

## 2014-03-29 LAB — HIV ANTIBODY (ROUTINE TESTING W REFLEX): HIV: NONREACTIVE

## 2014-03-29 LAB — TSH: TSH: 0.883 u[IU]/mL (ref 0.350–4.500)

## 2014-03-29 NOTE — Progress Notes (Signed)
Patient ID: Shannon Wolfe, female   DOB: 1990-06-30, 23 y.o.   MRN: [id number] Reviewed and agree

## 2014-04-05 ENCOUNTER — Telehealth: Payer: Self-pay | Admitting: Family Medicine

## 2014-04-05 NOTE — Telephone Encounter (Signed)
Please let her know that her TSH and HIV were both normal. Her Triglycerides were elevated and she can help treat this by working on her diet and exercise. We can discuss her results further at her appointment in a few weeks.

## 2014-04-05 NOTE — Telephone Encounter (Signed)
Pt called and would like to know what her lab results were. She also wanted the nurse to know that she still has not started her menstrual cycle. jw

## 2014-04-11 ENCOUNTER — Telehealth: Payer: Self-pay | Admitting: Family Medicine

## 2014-04-11 NOTE — Telephone Encounter (Signed)
Pt is calling to inform the doctor she still has not started her menstrual cycle and she has taken 2 pregnancy test and they are negative jw

## 2014-04-12 NOTE — Telephone Encounter (Signed)
Please let Shannon Wolfe that we can discuss this at her next appointment. If she would like to be seen sooner, please have her schedule an appointment with me or another physician.

## 2014-04-14 NOTE — Telephone Encounter (Signed)
Patient coming in on 04/21/2014 to see Dr. Gerlean Ren

## 2014-04-21 ENCOUNTER — Encounter: Payer: Self-pay | Admitting: Family Medicine

## 2014-04-21 ENCOUNTER — Other Ambulatory Visit (HOSPITAL_COMMUNITY)
Admission: RE | Admit: 2014-04-21 | Discharge: 2014-04-21 | Disposition: A | Discharge status: Home / Self Care | Payer: Medicaid Other | Source: Ambulatory Visit | Attending: Family Medicine | Admitting: Family Medicine

## 2014-04-21 ENCOUNTER — Ambulatory Visit (INDEPENDENT_AMBULATORY_CARE_PROVIDER_SITE_OTHER): Payer: Medicaid Other | Admitting: Family Medicine

## 2014-04-21 VITALS — BP 122/77 | HR 90 | Temp 98.2°F | Ht 60.0 in | Wt 137.6 lb

## 2014-04-21 DIAGNOSIS — Z01411 Encounter for gynecological examination (general) (routine) with abnormal findings: Secondary | ICD-10-CM | POA: Diagnosis not present

## 2014-04-21 DIAGNOSIS — Z1151 Encounter for screening for human papillomavirus (HPV): Secondary | ICD-10-CM | POA: Diagnosis present

## 2014-04-21 DIAGNOSIS — Z113 Encounter for screening for infections with a predominantly sexual mode of transmission: Secondary | ICD-10-CM | POA: Insufficient documentation

## 2014-04-21 DIAGNOSIS — R87810 Cervical high risk human papillomavirus (HPV) DNA test positive: Secondary | ICD-10-CM | POA: Diagnosis present

## 2014-04-21 DIAGNOSIS — Z3200 Encounter for pregnancy test, result unknown: Secondary | ICD-10-CM

## 2014-04-21 DIAGNOSIS — N912 Amenorrhea, unspecified: Secondary | ICD-10-CM

## 2014-04-21 DIAGNOSIS — N915 Oligomenorrhea, unspecified: Secondary | ICD-10-CM

## 2014-04-21 DIAGNOSIS — Z124 Encounter for screening for malignant neoplasm of cervix: Secondary | ICD-10-CM

## 2014-04-21 LAB — CBC
HCT: 40.3 % (ref 36.0–46.0)
Hemoglobin: 13.9 g/dL (ref 12.0–15.0)
MCH: 31.2 pg (ref 26.0–34.0)
MCHC: 34.5 g/dL (ref 30.0–36.0)
MCV: 90.4 fL (ref 78.0–100.0)
MPV: 10.1 fL (ref 8.6–12.4)
Platelets: 351 10*3/uL (ref 150–400)
RBC: 4.46 MIL/uL (ref 3.87–5.11)
RDW: 13.3 % (ref 11.5–15.5)
WBC: 11.3 10*3/uL — AB (ref 4.0–10.5)

## 2014-04-21 LAB — POCT URINE PREGNANCY: Preg Test, Ur: NEGATIVE

## 2014-04-21 MED ORDER — MEDROXYPROGESTERONE ACETATE 10 MG PO TABS: 10.0000 mg | ORAL_TABLET | Freq: Every day | ORAL | Status: DC

## 2014-04-21 NOTE — Assessment & Plan Note (Signed)
-   Last menstrual period February 18, 2014 - Pregnancy test negative - Pap smear today. Gonorrhea/Chlamydia tested. 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  - Prescription for Provera 10mg  x10 days given  - Hope withdrawal menstruation will occur at end of 10 day course  - Follow up in 6 weeks to evaluate for effectiveness of Provera and normalization of menstrual cycles - Labs to further evaluate cause of oligomenorrhea  - CBC, Prolactin, FSH, and testosterone today  - Will contact Mrs. Lehtinen for any abnormal results - Plan to further evaluate infertility at future visits. Discussed that husband should be evaluated as well. States husband cannot afford doctor and has no insurance. Has been trying to get pregnant for two years.

## 2014-04-21 NOTE — Progress Notes (Signed)
Subjective:     Patient ID: Shannon Wolfe, female   DOB: 05-29-1990, 24 y.o.   MRN: [id number]  HPI Mrs. Morash is a 24yo female presenting today for Pap Smear and further evaluation of her amenorrhea. - Last menstrual period ended November 13. Denies any bleeding or spotting since then. - Notes regular menstrual periods until placed on Mirena. Was irregular for a few months with Mirena, but then her menstrual cycle normalized.  - Mirena placed seven years ago and was present off and on for five years. Removed two years ago - Has not used any other method of birth control - Notes similar history of amenorrhea after her son was born. Provera used and periods normalized. - First menstrual period noted in middle school. Unsure of exact age. - Notes abdominal pain in left and right lower quadrants. Stabbing. Goes through to back. Occurs every day or every other day.  - Has only been pregnant once. One child--2 years old - Has been trying to get pregnant for two years (since Mirena removed). Father of 30 year old child not current husband. - Last sexually active yesterday  Review of Systems  Gastrointestinal: Positive for abdominal pain.  Genitourinary: Positive for menstrual problem. Negative for vaginal bleeding and genital sores.  Neurological: Negative for headaches.       Objective:   Physical Exam  Constitutional: She is oriented to person, place, and time. She appears well-developed and well-nourished. No distress.  Cardiovascular: Normal rate and regular rhythm.  Exam reveals no gallop and no friction rub.   No murmur heard. Pulmonary/Chest: Breath sounds normal. No respiratory distress. She has no wheezes. She has no rales.  Abdominal: Soft. Bowel sounds are normal. She exhibits no distension and no mass. There is no tenderness. There is no rebound and no guarding.  Genitourinary: There is no rash, tenderness, lesion or injury on the right labia. There is no rash,  tenderness, lesion or injury on the left labia. Cervix exhibits no motion tenderness, no discharge and no friability. Right adnexum displays no mass and no tenderness. Left adnexum displays no mass and no tenderness. No tenderness in the vagina. No signs of injury around the vagina. No vaginal discharge found.  Musculoskeletal: She exhibits no edema.  Neurological: She is alert and oriented to person, place, and time.       Assessment:     Please see Problem List for Assessment.    Plan:     Please see Problem List for Plan.

## 2014-04-21 NOTE — Patient Instructions (Addendum)
Thank you so much for coming to visit with me today! We will do a pap smear today. We are also doing quite a bit of lab work so we can try and figure out why you are not having your period. Your pregnancy test was negative. We will let you know if any of your labs come back abnormal! I am also sending in a prescription for Provera, after which you should have a period.  Please follow up in 6 weeks. Thanks again! Dr. Gerlean Ren  Medroxyprogesterone tablets What is this medicine? MEDROXYPROGESTERONE (me DROX ee proe JES te rone) is a hormone in a class called progestins. It is commonly used to prevent the uterine lining from overgrowth in women taking an estrogen after menopause. It is also used to treat irregular menstrual bleeding or a lack of menstrual bleeding in women. This medicine may be used for other purposes; ask your health care provider or pharmacist if you have questions. COMMON BRAND NAME(S): Amen, Provera What should I tell my health care provider before I take this medicine? They need to know if you have any of these conditions: -blood vessel disease or a history of a blood clot in the lungs or legs -breast, cervical or vaginal cancer -heart disease -kidney disease -liver disease -migraine -recent miscarriage or abortion -mental depression -migraine -seizures (convulsions) -stroke -vaginal bleeding that has not been evaluated -an unusual or allergic reaction to medroxyprogesterone, other medicines, foods, dyes, or preservatives -pregnant or trying to get pregnant -breast-feeding How should I use this medicine? Take this medicine by mouth with a glass of water. Follow the directions on the prescription label. Take your doses at regular intervals. Do not take your medicine more often than directed. Talk to your pediatrician regarding the use of this medicine in children. Special care may be needed. While this drug may be prescribed for children as young as 13 years for selected  conditions, precautions do apply. Overdosage: If you think you have taken too much of this medicine contact a poison control center or emergency room at once. NOTE: This medicine is only for you. Do not share this medicine with others. What if I miss a dose? If you miss a dose, take it as soon as you can. If it is almost time for your next dose, take only that dose. Do not take double or extra doses. What may interact with this medicine? -barbiturate medicines for inducing sleep or treating seizures (convulsions) -bosentan -carbamazepine -phenytoin -rifampin -St. John's Wort This list may not describe all possible interactions. Give your health care provider a list of all the medicines, herbs, non-prescription drugs, or dietary supplements you use. Also tell them if you smoke, drink alcohol, or use illegal drugs. Some items may interact with your medicine. What should I watch for while using this medicine? Visit your health care professional for regular checks on your progress. You will need a regular breast and pelvic exam. If you have any reason to think you are pregnant, stop taking this medicine at once and contact your doctor or health care professional. What side effects may I notice from receiving this medicine? Side effects that you should report to your doctor or health care professional as soon as possible: -breast tenderness or discharge -changes in mood or emotions, such as depression -changes in vision or speech -pain in the abdomen, chest, groin, or leg -severe headache -skin rash, itching, or hives -sudden shortness of breath -unusually weak or tired -yellowing of skin or eyes Side  effects that usually do not require medical attention (report to your doctor or health care professional if they continue or are bothersome): -acne -change in menstrual bleeding pattern or flow -changes in sexual desire -facial hair growth -fluid retention and swelling -headache -upset  stomach -weight gain or loss This list may not describe all possible side effects. Call your doctor for medical advice about side effects. You may report side effects to FDA at 1-800-FDA-1088. Where should I keep my medicine? Keep out of the reach of children. Store at room temperature between 20 and 25 degrees C (68 and 77 degrees F). Throw away any unused medicine after the expiration date. NOTE: This sheet is a summary. It may not cover all possible information. If you have questions about this medicine, talk to your doctor, pharmacist, or health care provider.  2015, Elsevier/Gold Standard. (2008-03-24 11:26:12)   Infertility WHAT IS INFERTILITY?  Infertility is usually defined as not being able to get pregnant after trying for one year of regular sexual intercourse without the use of contraceptives. Or not being able to carry a pregnancy to term and have a baby. The infertility rate in the [place] is around 10%. Pregnancy is the result of a chain of events. A woman must release an egg from one of her ovaries (ovulation). The egg must be fertilized by the female sperm. Then it travels through a fallopian tube into the uterus (womb), where it attaches to the wall of the uterus and grows. A man must have enough sperm, and the sperm must join with (fertilize) the egg along the way, at the proper time. The fertilized egg must then become attached to the inside of the uterus. While this may seem simple, many things can happen to prevent pregnancy from occurring.  WHOSE PROBLEM IS IT?  About 20% of infertility cases are due to problems with the man (female factors) and 65% are due to problems with the woman (female factors). Other cases are due to a combination of female and female factors or to unknown causes.  WHAT CAUSES INFERTILITY IN MEN?  Infertility in men is often caused by problems with making enough normal sperm or getting the sperm to reach the egg. Problems with sperm may exist from birth  or develop later in life, due to illness or injury. Some men produce no sperm, or produce too few sperm (oligospermia). Other problems include:  Sexual dysfunction.  Hormonal or endocrine problems.  Age. Female fertility decreases with age, but not at as young an age as female fertility.  Infection.  Congenital problems. Birth defect, such as absence of the tubes that carry the sperm (vas deferens).  Genetic/chromosomal problems.  Antisperm antibody problems.  Retrograde ejaculation (sperm go into the bladder).  Varicoceles, spematoceles, or tumors of the testicles.  Lifestyle can influence the number and quality of a man's sperm.  Alcohol and drugs can temporarily reduce sperm quality.  Environmental toxins, including pesticides and lead, may cause some cases of infertility in men. WHAT CAUSES INFERTILITY IN WOMEN?   Problems with ovulation account for most infertility in women. Without ovulation, eggs are not available to be fertilized.  Signs of problems with ovulation include irregular menstrual periods or no periods at all.  Simple lifestyle factors, including stress, diet, or athletic training, can affect a woman's hormonal balance.  Age. Fertility begins to decrease in women in the early 44s and is worse after age 54.  Much less often, a hormonal imbalance from a serious medical  problem, such as a pituitary gland tumor, thyroid or other chronic medical disease, can cause ovulation problems.  Pelvic infections.  Polycystic ovary syndrome (increase in female hormones, unable to ovulate).  Alcohol or illegal drugs.  Environmental toxins, radiation, pesticides, and certain chemicals.  Aging is an important factor in female infertility.  The ability of a woman's ovaries to produce eggs declines with age, especially after age 27. About one third of couples where the woman is over 78 will have problems with fertility.  By the time she reaches menopause when her monthly  periods stop for good, a woman can no longer produce eggs or become pregnant.  Other problems can also lead to infertility in women. If the fallopian tubes are blocked at one or both ends, the egg cannot travel through the tubes into the uterus. Scar tissue (adhesions) in the pelvis may cause blocked tubes. This may result from pelvic inflammatory disease, endometriosis, or surgery for an ectopic pregnancy (fertilized egg implanted outside the uterus) or any pelvic or abdominal surgery causing adhesions.  Fibroid tumors or polyps of the uterus.  Congenital (birth defect) abnormalities of the uterus.  Infection of the cervix (cervicitis).  Cervical stenosis (narrowing).  Abnormal cervical mucus.  Polycystic ovary syndrome.  Having sexual intercourse too often (every other day or 4 to 5 times a week).  Obesity.  Anorexia.  Poor nutrition.  Over exercising, with loss of body fat.  DES. Your mother received diethylstilbesterol hormone when pregnant with you. HOW IS INFERTILITY TESTED?  If you have been trying to have a baby without success, you may want to seek medical help. You should not wait for one year of trying before seeing a health care provider if:  You are over 35.  You have reason to believe that there may be a fertility problem. A medical evaluation may determine the reasons for a couple's infertility. Usually this process begins with:  Physical exams.  Medical histories of both partners.  Sexual histories of both partners. If there is no obvious problem, like improperly timed intercourse or absence of ovulation, tests may be needed.   For a man, testing usually begins with tests of his semen to look at:  The number of sperm.  The shape of sperm.  Movement of his sperm.  Taking a complete medical and surgical history.  Physical examination.  Check for infection of the female reproductive organs. Sometimes hormone tests are done.   For a woman, the first  step in testing is to find out if she is ovulating each month. There are several ways to do this. For example, she can keep track of changes in her morning body temperature and in the texture of her cervical mucus. Another tool is a home ovulation test kit, which can be bought at drug or grocery stores.  Checks of ovulation can also be done in the doctor's office, using blood tests for hormone levels or ultrasound tests of the ovaries. If the woman is ovulating, more tests will need to be done. Some common female tests include:  Hysterosalpingogram: An x-ray of the fallopian tubes and uterus after they are injected with dye. It shows if the tubes are open and shows the shape of the uterus.  Laparoscopy: An exam of the tubes and other female organs for disease. A lighted tube called a laparoscope is used to see inside the abdomen.  Endometrial biopsy: Sample of uterus tissue taken on the first day of the menstrual period, to see if  the tissue indicates you are ovulating.  Transvaginal ultrasound: Examines the female organs.  Hysteroscopy: Uses a lighted tube to examine the cervix and inside the uterus, to see if there are any abnormalities inside the uterus. TREATMENT  Depending on the test results, different treatments can be suggested. The type of treatment depends on the cause. 85 to 90% of infertility cases are treated with drugs or surgery.   Various fertility drugs may be used for women with ovulation problems. It is important to talk with your caregiver about the drug to be used. You should understand the drug's benefits and side effects. Depending on the type of fertility drug and the dosage of the drug used, multiple births (twins or multiples) can occur in some women.  If needed, surgery can be done to repair damage to a woman's ovaries, fallopian tubes, cervix, or uterus.  Surgery or medical treatment for endometriosis or polycystic ovary syndrome. Sometimes a man has an infertility  problem that can be corrected with medicine or by surgery.  Intrauterine insemination (IUI) of sperm, timed with ovulation.  Change in lifestyle, if that is the cause (lose weight, increase exercise, and stop smoking, drinking excessively, or taking illegal drugs).  Other types of surgery:  Removing growths inside and on the uterus.  Removing scar tissue from inside of the uterus.  Fixing blocked tubes.  Removing scar tissue in the pelvis and around the female organs. WHAT IS ASSISTED REPRODUCTIVE TECHNOLOGY (ART)?  Assisted reproductive technology (ART) is another form of special methods used to help infertile couples. ART involves handling both the woman's eggs and the man's sperm. Success rates vary and depend on many factors. ART can be expensive and time-consuming. But ART has made it possible for many couples to have children that otherwise would not have been conceived. Some methods are listed below:  In vitro fertilization (IVF). IVF is often used when a woman's fallopian tubes are blocked or when a man has low sperm counts. A drug is used to stimulate the ovaries to produce multiple eggs. Once mature, the eggs are removed and placed in a culture dish with the man's sperm for fertilization. After about 40 hours, the eggs are examined to see if they have become fertilized by the sperm and are dividing into cells. These fertilized eggs (embryos) are then placed in the woman's uterus. This bypasses the fallopian tubes.  Gamete intrafallopian transfer (GIFT) is similar to IVF, but used when the woman has at least one normal fallopian tube. Three to five eggs are placed in the fallopian tube, along with the man's sperm, for fertilization inside the woman's body.  Zygote intrafallopian transfer (ZIFT), also called tubal embryo transfer, combines IVF and GIFT. The eggs retrieved from the woman's ovaries are fertilized in the lab and placed in the fallopian tubes rather than in the  uterus.  ART procedures sometimes involve the use of donor eggs (eggs from another woman) or previously frozen embryos. Donor eggs may be used if a woman has impaired ovaries or has a genetic disease that could be passed on to her baby.  When performing ART, you are at higher risk for resulting in multiple pregnancies, twins, triplets or more.  Intracytoplasma sperm injection is a procedure that injects a single sperm into the egg to fertilize it.  Embryo transplant is a procedure that starts after growing an embryo in a special media (chemical solution) developed to keep the embryo alive for 2 to 5 days, and then transplanting it  into the uterus. In cases where a cause cannot be found and pregnancy does not occur, adoption may be a consideration. Document Released: 03/28/2003 Document Revised: 06/17/2011 Document Reviewed: 02/21/2009 [facility] Patient Information 2015 [place], [place]. This information is not intended to replace advice given to you by your health care provider. Make sure you discuss any questions you have with your health care provider.

## 2014-04-22 LAB — PROLACTIN: Prolactin: 4 ng/mL

## 2014-04-22 LAB — TESTOSTERONE: TESTOSTERONE: 54 ng/dL (ref 10–70)

## 2014-04-22 LAB — FOLLICLE STIMULATING HORMONE: FSH: 4.8 m[IU]/mL

## 2014-04-25 LAB — CYTOLOGY - PAP

## 2014-05-04 ENCOUNTER — Telehealth: Payer: Self-pay | Admitting: Family Medicine

## 2014-05-04 NOTE — Telephone Encounter (Signed)
Contacted Shannon Wolfe with lab results and pap smear result. Discussed colposcopy. 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   Also asked me to cancel appointment for tomorrow to evaluate rash. States she is in the process of moving to a different house and is unable to make it in for the appointment.

## 2014-05-05 ENCOUNTER — Ambulatory Visit: Payer: Medicaid Other | Admitting: Family Medicine

## 2014-05-25 ENCOUNTER — Telehealth: Payer: Self-pay | Admitting: Family Medicine

## 2014-05-25 NOTE — Telephone Encounter (Signed)
Noted that Shannon Wolfe has not yet scheduled a colposcopy clinic. Called to remind her. States she has been in the process of moving houses and getting her child into a different school and completely forgot about it. Agrees to call first thing in the morning to schedule visit. States she has experienced two menstrual cycles since presenting to last office visit.  Junie Panning, [place], PGY1

## 2014-06-03 ENCOUNTER — Emergency Department (HOSPITAL_COMMUNITY)
Admission: EM | Admit: 2014-06-03 | Discharge: 2014-06-03 | Disposition: A | Discharge status: Home / Self Care | Payer: Medicaid Other | Source: Home / Self Care | Attending: Family Medicine | Admitting: Family Medicine

## 2014-06-03 ENCOUNTER — Encounter (HOSPITAL_COMMUNITY): Payer: Self-pay | Admitting: Emergency Medicine

## 2014-06-03 DIAGNOSIS — N390 Urinary tract infection, site not specified: Secondary | ICD-10-CM | POA: Diagnosis not present

## 2014-06-03 LAB — POCT URINALYSIS DIP (DEVICE)
Bilirubin Urine: NEGATIVE
Glucose, UA: NEGATIVE mg/dL
Ketones, ur: NEGATIVE mg/dL
NITRITE: POSITIVE — AB
Protein, ur: 30 mg/dL — AB
Specific Gravity, Urine: >=1.03 (ref 1.005–1.030)
UROBILINOGEN UA: 0.2 mg/dL (ref 0.0–1.0)
pH: 6 (ref 5.0–8.0)

## 2014-06-03 LAB — POCT PREGNANCY, URINE: Preg Test, Ur: NEGATIVE

## 2014-06-03 MED ORDER — NITROFURANTOIN MONOHYD MACRO 100 MG PO CAPS: 100.0000 mg | ORAL_CAPSULE | Freq: Two times a day (BID) | ORAL | Status: DC

## 2014-06-03 NOTE — ED Notes (Signed)
Pt is here today for a possible UTI, pt describes pain when she urinates but the pain occurs towards the end of her urine stream, this has been going on since yesterday

## 2014-06-03 NOTE — Discharge Instructions (Signed)

## 2014-06-03 NOTE — ED Provider Notes (Signed)
CSN: [id number]     Arrival date & time 06/03/14  1140 History   First MD Initiated Contact with Patient 06/03/14 1156     Chief Complaint  Patient presents with  . Urinary Tract Infection   (Consider location/radiation/quality/duration/timing/severity/associated sxs/prior Treatment) Patient is a 24 y.o. female presenting with dysuria. The history is provided by the patient.  Dysuria Pain quality:  Burning Pain severity:  Moderate Onset quality:  Gradual Duration:  24 hours Timing:  Constant Progression:  Worsening Chronicity:  New Recent urinary tract infections: no   Urinary symptoms: discolored urine, frequent urination and hematuria   Associated symptoms: no abdominal pain, no fever, no flank pain, no genital lesions, no nausea, no vaginal discharge and no vomiting   Risk factors: not pregnant, no recurrent urinary tract infections, no renal disease, no sexually transmitted infections and no urinary catheter     History reviewed. No pertinent past medical history. History reviewed. No pertinent past surgical history. Family History  Problem Relation Age of Onset  . Diabetes Father    History  Substance Use Topics  . Smoking status: Current Every Day Smoker -- 0.50 packs/day    Types: Cigarettes  . Smokeless tobacco: Not on file  . Alcohol Use: No   OB History    No data available     Review of Systems  Constitutional: Negative for fever and appetite change.  Respiratory: Negative.   Gastrointestinal: Negative for nausea, vomiting, abdominal pain, diarrhea, constipation and abdominal distention.  Genitourinary: Positive for dysuria, urgency, frequency, hematuria and pelvic pain. Negative for flank pain, vaginal bleeding, vaginal discharge, difficulty urinating and vaginal pain.  Musculoskeletal: Negative for back pain.  Skin: Negative.     Allergies  Review of patient's allergies indicates no known allergies.  Home Medications   Prior to Admission medications    Medication Sig Start Date End Date Taking? Authorizing Provider  dextromethorphan (DELSYM) 30 MG/5ML liquid Take 5 mLs (30 mg total) by mouth as needed for cough. 03/18/14   Kaitlyn Szekalski, PA-C  loratadine-pseudoephedrine (CLARITIN-D 12 HOUR) 5-120 MG per tablet Take 1 tablet by mouth 2 (two) times daily. 03/18/14   Kaitlyn Szekalski, PA-C  medroxyPROGESTERone (PROVERA) 10 MG tablet Take 1 tablet (10 mg total) by mouth daily. 04/21/14   Jordan N Rumley, DO  nitrofurantoin, macrocrystal-monohydrate, (MACROBID) 100 MG capsule Take 1 capsule (100 mg total) by mouth 2 (two) times daily. 06/03/14   Audelia Hives Zaron Zwiefelhofer, PA   BP 114/78 mmHg  Pulse 101  Temp(Src) 97 F (36.1 C) (Oral)  SpO2 97%  LMP 05/22/2014 Physical Exam  Constitutional: She is oriented to person, place, and time. She appears well-developed and well-nourished. No distress.  HENT:  Head: Normocephalic and atraumatic.  Eyes: Conjunctivae are normal. No scleral icterus.  Cardiovascular: Normal rate, regular rhythm and normal heart sounds.   Pulmonary/Chest: Effort normal and breath sounds normal. No respiratory distress. She has no wheezes.  Abdominal: Soft. Bowel sounds are normal. She exhibits no distension. There is no tenderness. There is no rigidity, no rebound, no guarding and no CVA tenderness.  Neurological: She is alert and oriented to person, place, and time.  Skin: Skin is warm and dry. No rash noted. No erythema.  Psychiatric: She has a normal mood and affect. Her behavior is normal.  Nursing note and vitals reviewed.   ED Course  Procedures (including critical care time) Labs Review Labs Reviewed  POCT URINALYSIS DIP (DEVICE) - Abnormal; Notable for the following:  Hgb urine dipstick MODERATE (*)    Protein, ur 30 (*)    Nitrite POSITIVE (*)    Leukocytes, UA SMALL (*)    All other components within normal limits  URINE CULTURE  POCT PREGNANCY, URINE    Imaging Review No results found.   MDM    1. UTI (lower urinary tract infection)   UPT negative UA consistent with UTI Will send specimen for C&S and advise if results indicate need for change in therapy Treat at home with Macrobid and follow up PCP if no improvement.     Lutricia Feil, [place] 06/03/14 [phone]

## 2014-06-06 ENCOUNTER — Ambulatory Visit: Payer: Medicaid Other | Admitting: Family Medicine

## 2014-06-06 LAB — URINE CULTURE: Special Requests: NORMAL

## 2014-06-07 NOTE — ED Notes (Signed)
Urine culture: >100,000 colonies E. Coli.  Pt.adequately treated with Macrobid. Shannon Wolfe 06/07/2014

## 2014-06-13 ENCOUNTER — Ambulatory Visit (INDEPENDENT_AMBULATORY_CARE_PROVIDER_SITE_OTHER): Payer: Medicaid Other | Admitting: Family Medicine

## 2014-06-13 ENCOUNTER — Encounter: Payer: Self-pay | Admitting: Family Medicine

## 2014-06-13 VITALS — BP 116/70 | HR 83 | Temp 97.9°F | Ht 60.0 in | Wt 138.0 lb

## 2014-06-13 DIAGNOSIS — R21 Rash and other nonspecific skin eruption: Secondary | ICD-10-CM

## 2014-06-13 NOTE — Progress Notes (Signed)
    Subjective    Shannon Wolfe is a 24 y.o. female that presents for an office visit.   1. Rash: Rash was present about one month ago. The rash wrapped around right chest to back, and was painful. Patient has a history of shingles. She used epsom salt and oatmeal soaks to help with her symptoms, which improved after two weeks. She has a history of chicken pox as a child.  History  Substance Use Topics  . Smoking status: Current Every Day Smoker -- 0.50 packs/day    Types: Cigarettes  . Smokeless tobacco: Not on file  . Alcohol Use: No    No Known Allergies  No orders of the defined types were placed in this encounter.    ROS  Per HPI   Objective   BP 116/70 mmHg  Pulse 83  Temp(Src) 97.9 F (36.6 C) (Oral)  Ht 5' (1.524 m)  Wt 138 lb (62.596 kg)  BMI 26.95 kg/m2  LMP 05/22/2014  General: Well appearing female in no distress Skin: few papules located on right side of chest, non-painful, slight erythema.   Assessment and Plan    Rash: patient states she has a history of shingles. Symptoms resolved regarding last outbreak  Handout given  Discussed if recurrence, to make visit for follow-up

## 2014-06-13 NOTE — Patient Instructions (Addendum)
Thank you for coming to see me today. It was a pleasure. Today we talked about:   Rash: I am glad that your rash is better. If your rash returns, please come back for follow-up  If you have any questions or concerns, please do not hesitate to call the office at [phone].  Sincerely,  Cordelia Poche, MD  Shingles Shingles is caused by the same virus that causes chickenpox. The first feelings may be pain or tingling. A rash will follow in a couple days. The rash may occur on any area of the body. Long-lasting pain is more likely in an elderly person. It can last months to years. There are medicines that can help prevent pain if you start taking them early. HOME CARE   Take cool baths or place cool cloths on the rash as told by your doctor.  Take medicine only as told by your doctor.  Rest as told by your doctor.  Keep your rash clean with mild soap and cool water or as told by your doctor.  Do not scratch your rash. You may use calamine lotion to relieve itchy skin as told by your doctor.  Keep your rash covered with a loose bandage (dressing).  Avoid touching:  Babies.  Pregnant women.  Children with inflamed skin (eczema).  People who have gotten organ transplants.  People with chronic illnesses, such as leukemia or AIDS.  Wear loose-fitting clothing.  If the rash is on the face, you may need to see a specialist. Keep all appointments. Shingles must be kept away from the eyes, if possible.  Keep all follow-up visits as told by your doctor. GET HELP RIGHT AWAY IF:   You have any pain on the face or eye.  You lose feeling on one side of your face.  You have ear pain or ringing in your ear.  You cannot taste as well.  Your medicines do not help the pain.  Your redness or puffiness (swelling) spreads.  You feel like you are getting worse.  You have a fever. MAKE SURE YOU:   Understand these instructions.  Will watch your condition.  Will get help  right away if you are not doing well or get worse. Document Released: 09/11/2007 Document Revised: 08/09/2013 Document Reviewed: 09/11/2007 [facility] Patient Information 2015 [place], [place]. This information is not intended to replace advice given to you by your health care provider. Make sure you discuss any questions you have with your health care provider.

## 2014-06-27 NOTE — Progress Notes (Signed)
I was the preceptor for this encounter. Armon Orvis, M.D. 

## 2014-06-30 ENCOUNTER — Ambulatory Visit (INDEPENDENT_AMBULATORY_CARE_PROVIDER_SITE_OTHER): Payer: Medicaid Other | Admitting: Family Medicine

## 2014-06-30 ENCOUNTER — Encounter: Payer: Self-pay | Admitting: Family Medicine

## 2014-06-30 VITALS — BP 123/73 | HR 88 | Ht 60.0 in | Wt 139.1 lb

## 2014-06-30 DIAGNOSIS — R896 Abnormal cytological findings in specimens from other organs, systems and tissues: Secondary | ICD-10-CM | POA: Diagnosis not present

## 2014-06-30 DIAGNOSIS — Z3169 Encounter for other general counseling and advice on procreation: Secondary | ICD-10-CM

## 2014-06-30 DIAGNOSIS — IMO0002 Reserved for concepts with insufficient information to code with codable children: Secondary | ICD-10-CM

## 2014-06-30 LAB — POCT URINE PREGNANCY: Preg Test, Ur: NEGATIVE

## 2014-06-30 NOTE — Progress Notes (Signed)
Patient ID: Shannon Wolfe, female   DOB: [birth year], 24 y.o.   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 Reports some hx of "cervix being cut" during a procedure for IUD (unclear if insertion or removal) Is concerned this may be impeding her ability to get pregnant. Current female partner different from FOB her son.  Patient given informed consent, signed copy in the chart.  Placed in lithotomy position. Cervix viewed with speculum and colposcope after application of acetic acid.   Colposcopy adequate (entire squamocolumnar junctions seen  in entirety) ?  yes Acetowhite lesions?no Punctation?no Mosaicism?  no Abnormal vasculature?  no Biopsies?no ECC?no Complications? none  COMMENTS: Patient was given post procedure instructions.   1. ASCUS  With + hi risk HPV with clinically normal colposcopy Discussed HPV and abnormal; paps Pap smear in a year 2. Conception: discussed conception. Researched her chart and I could find nothing in notes about a complication from previous IUD insertion or removal. Her cervix appears normal on exam. As she has been 'trying" for 2 years, I recommended she f/u with PCP for discussion re further work up. As the current female partner is not same as FOB for her child, certainly consideration should be taken of evaluation sperm from female. Greater than 50% of our 45 minute office visit was spent in counseling and education regarding these issues.

## 2014-11-29 ENCOUNTER — Encounter: Payer: Self-pay | Admitting: Family Medicine

## 2014-11-29 ENCOUNTER — Ambulatory Visit (INDEPENDENT_AMBULATORY_CARE_PROVIDER_SITE_OTHER): Payer: Medicaid Other | Admitting: Family Medicine

## 2014-11-29 VITALS — BP 116/79 | HR 84 | Temp 98.2°F | Ht 59.0 in | Wt 137.6 lb

## 2014-11-29 DIAGNOSIS — N912 Amenorrhea, unspecified: Secondary | ICD-10-CM

## 2014-11-29 DIAGNOSIS — N915 Oligomenorrhea, unspecified: Secondary | ICD-10-CM

## 2014-11-29 LAB — POCT URINE PREGNANCY: Preg Test, Ur: NEGATIVE

## 2014-11-29 NOTE — Assessment & Plan Note (Signed)
Patient is presenting with hopes of being pregnant despite numerous negative urine pregnancy tests at home. We discuss that the urine pregnancy test here is >98% sensitive (and even more sensitive after missing a cycle for greater than a week) and performing a blood test would not be useful. She is understandably upset as her and her husband have been attempting to conceive for more than 2 years.  No hirsutism or evidence of hyperandrogenism noted on exam. DHEA not done in the initial work up previously but unsure if this would contribute anything.  - In the future, could consider attempting Provera again as it seems like she was having a normal cycle up until about May after the last course. - Urged pt to f/u with her PCP who has already started the work up. - Re-iterated what her PCP stated about having her husband being evaluated for infertility given she has had a child in the past but the current husband has not.  - Pt met with mental health provider given stress, anxiety, and depression- no evidence of SI, HI, self harm, or AVH. Will attempt biofeedback mechanisms prior to starting any medication which may need to be weaned if she becomes pregnant.

## 2014-11-29 NOTE — Progress Notes (Signed)
Dr. Lorenso Courier requested a [place].  Presenting Issue: Anxious and depressive symptoms related to difficulty conceiving.   Report of symptoms: Patient reported lethargy, increased sleep, weight gain, and difficulty coping with lack of pregnancy and irregular periods since November, 2015. She also reported symptoms consistent with anxiety including skin flushing, shakiness, quick and shallow breathing, and general irritability and uneasiness.  Duration of CURRENT symptoms: Patient reported that anxiety symptoms have been present "since she was a teenager" but have recently been exacerbated due to irregular periods and difficulty getting pregnant. Her depressive symptoms began soon after the first instances of irregular periods, as she was trying to conceive at the time.   Age of onset of first mood disturbance: Approximately age 24 for anxiety symptoms and age 24 for depressive symptoms.  Impact on function: Patient reported that she feels overwhelmed with concern that she will be unable to have another child. She also stated that she spends "too much time" in bed, is always lethargic, and frequently cries when she thinks about her current difficulty getting pregnant.   Psychiatric History - Diagnoses: No previous diagnoses - Hospitalizations:  None - Pharmacotherapy: None - Outpatient therapy: None  Family history of psychiatric issues: Patient reported that her mother has diagnoses of generalized anxiety disorder and major depressive disorder.  Current and history of substance use: Patient denied current or past substance use.   Assessment / Plan / Recommendations: Patient's report of symptoms were consistent with anxiety and depression. Patient and [facility] briefly discussed distress tolerance techniques, as well as relaxation exercises such as diaphragmatic breathing and progressive muscle relaxation. Patient agreed to return in two weeks for a follow-up.

## 2014-11-29 NOTE — Progress Notes (Signed)
Patient ID: Shannon Wolfe, female   DOB: [birth year], 24 y.o.   MRN: [id number]    Subjective: CC: pregnancy test HPI: Patient is a 24 y.o. female with a past medical history of oligomenorrhea presenting to clinic today for a pregnancy test.   Patient states she has not had regular cycle since May (LMP 08/07/2014) therefore she feels she must be pregnant.  She had spotting for half a day on June 12th but then nothing. She has taken numerous urine pregnancy tests from the [facility] that have been negative. States her weight and appetite have fluctuated. She endorses being fatigued, anxious, depressed. No breast tenderness.She becomes tearful, however not too surprised, when I tell her the urine pregnancy was negative here as well. No facial hair or problems with acne.   The patient has had irregular menstrual cycle (would sometimes skip a month) since she had her Mierna removed 4-5 years ago. She normally bleeds for a week (moderate flow). From EMR review, she has seen her PCP about this in the past but hasn't followed up. Pt frustrated about not being able to see her PCP, however it does not appear she's had too many visits to our clinic recently.   Social History: Continues to smoke 0.5ppd   ROS: All other systems reviewed and are negative.  Past Medical History Patient Active Problem List   Diagnosis Date Noted  . 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 Establishing care with new doctor, encounter for 03/28/2014    Medications- reviewed and updated Current Outpatient Prescriptions  Medication Sig Dispense Refill  . loratadine-pseudoephedrine (CLARITIN-D 12 HOUR) 5-120 MG per tablet Take 1 tablet by mouth 2 (two) times daily. 30 tablet 0   No current facility-administered medications for this visit.    Objective: Office vital signs reviewed. BP 116/79 mmHg  Pulse 84  Temp(Src) 98.2 F (36.8 C) (Oral)  Ht '4\' 11"'  (1.499 m)  Wt 137 lb 9 oz (62.398 kg)  BMI 27.77 kg/m2  LMP      Physical Examination:  General: Awake, alert, well nourished, anxious and tearful  HEENT: No facial hair noted. Cardio: RRR, no m/r/g noted.  Pulm: No increased WOB.  CTAB, without wheezes, rhonchi or crackles noted.  GI: soft, NT/ND,+BS x4, no hepatomegaly, no splenomegaly Skin: dry, intact, no rashes or lesions. No stria noted.   Assessment/Plan: Oligomenorrhea Patient is presenting with hopes of being pregnant despite numerous negative urine pregnancy tests at home. We discuss that the urine pregnancy test here is >98% sensitive (and even more sensitive after missing a cycle for greater than a week) and performing a blood test would not be useful. She is understandably upset as her and her husband have been attempting to conceive for more than 2 years.  No hirsutism or evidence of hyperandrogenism noted on exam. DHEA not done in the initial work up previously but unsure if this would contribute anything.  - In the future, could consider attempting Provera again as it seems like she was having a normal cycle up until about May after the last course. - Urged pt to f/u with her PCP who has already started the work up. - Re-iterated what her PCP stated about having her husband being evaluated for infertility given she has had a child in the past but the current husband has not.  - Pt met with mental health provider given stress, anxiety, and depression- no evidence of SI, HI, self harm, or AVH. Will attempt biofeedback mechanisms prior to starting any medication  which may need to be weaned if she becomes pregnant.      Orders Placed This Encounter  Procedures  . POCT urine pregnancy    No orders of the defined types were placed in this encounter.    Archie Patten PGY-2, [place]

## 2014-11-29 NOTE — Patient Instructions (Signed)
Your pregnancy test was negative.  If your periods continue to be so irregular, please follow up with Dr. Gerlean Ren. I think it would be best to have close follow up with Cristie Hem or someone else with mental health.  If you continue to have problems with getting pregnant, your husband should be evaluated as this is normally the cause and it is the easiest/cost effective approach.

## 2014-12-08 ENCOUNTER — Telehealth: Payer: Self-pay | Admitting: Family Medicine

## 2014-12-08 NOTE — Telephone Encounter (Signed)
Would like to speak to [facility] about what was discussed at last meeting. She is concerned that nothing is working and needs help. She states that she was told to call if her symptoms worsened. Shannon Wolfe, ASA

## 2014-12-09 ENCOUNTER — Telehealth: Payer: Self-pay | Admitting: Family Medicine

## 2014-12-09 NOTE — Telephone Encounter (Signed)
Will forward to Elton Sin who is the [employer] today.  He can call the patient and get some details and develop a plan from there.  Patient is scheduled for follow-up with Cristie Hem on 9/6.

## 2014-12-09 NOTE — Telephone Encounter (Signed)
[facility] returned patient's call to address concerns about symptoms of stress and anxiety. Patient stated that she is concerned that her partner cannot get an appointment to have his sperm count tested for at least 3 months, and that she would like to start medication for depression given the long wait before testing can be performed. [facility] stated that he would consult with a physician to determine whether testing can be expedited and to determine whether an appointment should be scheduled to evaluate for starting on antidepressant medication.  After consulting with preceptor, [facility] called patient back and left message providing a referral to [facility] for sperm count testing for patient's partner. [facility] also asked patient to return his call to let him know whether patient and her partner are still trying to conceive while waiting for testing, and that if not, [facility] will contact front desk to schedule an appointment with PCP.

## 2014-12-13 ENCOUNTER — Ambulatory Visit: Payer: Medicaid Other

## 2014-12-13 ENCOUNTER — Telehealth: Payer: Self-pay | Admitting: Family Medicine

## 2014-12-27 ENCOUNTER — Telehealth: Payer: Self-pay | Admitting: Psychology

## 2014-12-27 ENCOUNTER — Ambulatory Visit: Payer: Medicaid Other

## 2014-12-28 ENCOUNTER — Ambulatory Visit: Payer: Medicaid Other | Admitting: Family Medicine

## 2015-11-08 ENCOUNTER — Encounter (HOSPITAL_COMMUNITY): Payer: Self-pay | Admitting: *Deleted

## 2015-11-08 ENCOUNTER — Inpatient Hospital Stay (HOSPITAL_COMMUNITY)
Admission: AD | Admit: 2015-11-08 | Discharge: 2015-11-08 | Disposition: A | Discharge status: Home / Self Care | Payer: Medicaid Other | Source: Ambulatory Visit | Attending: Obstetrics & Gynecology | Admitting: Obstetrics & Gynecology

## 2015-11-08 DIAGNOSIS — N946 Dysmenorrhea, unspecified: Secondary | ICD-10-CM | POA: Insufficient documentation

## 2015-11-08 DIAGNOSIS — N938 Other specified abnormal uterine and vaginal bleeding: Secondary | ICD-10-CM | POA: Insufficient documentation

## 2015-11-08 DIAGNOSIS — F17210 Nicotine dependence, cigarettes, uncomplicated: Secondary | ICD-10-CM | POA: Insufficient documentation

## 2015-11-08 DIAGNOSIS — Z3202 Encounter for pregnancy test, result negative: Secondary | ICD-10-CM | POA: Insufficient documentation

## 2015-11-08 HISTORY — DX: Abnormal uterine and vaginal bleeding, unspecified: N93.9

## 2015-11-08 LAB — POCT PREGNANCY, URINE: PREG TEST UR: NEGATIVE

## 2015-11-08 LAB — URINALYSIS, ROUTINE W REFLEX MICROSCOPIC
Glucose, UA: NEGATIVE mg/dL
Ketones, ur: 15 mg/dL — AB
LEUKOCYTES UA: NEGATIVE
Nitrite: NEGATIVE
Protein, ur: NEGATIVE mg/dL
SPECIFIC GRAVITY, URINE: 1.025 (ref 1.005–1.030)
pH: 6 (ref 5.0–8.0)

## 2015-11-08 LAB — URINE MICROSCOPIC-ADD ON

## 2015-11-08 MED ORDER — IBUPROFEN 600 MG PO TABS: 600.0000 mg | ORAL_TABLET | Freq: Four times a day (QID) | ORAL | 1 refills | Status: DC | PRN

## 2015-11-08 NOTE — MAU Provider Note (Signed)
Chief Complaint:  No chief complaint on file.   First Provider Initiated Contact with Patient 11/08/15 0000000     HPI  Shannon Wolfe is a 25 y.o. G1P0 who presents to maternity admissions reporting heavy bleeding yesterday with clots. Also has cramping with bleeding.  Yesterday soaked a tampon every hour, but less bleeding today.   Thought she might be pregnant since she has never bled this much before   Thought the clots meant she was pregnant. "kind of wants to get pregnant, but don't want to also".  Did not take any pregnancy tests at home.  Periods have never been regular.  One of her friends told her she needs to be on fertility pills.  Has tried to get pregnant for several years. . She reports vaginal bleeding, vaginal itching/burning, urinary symptoms, h/a, dizziness, n/v, or fever/chills.    RN Note: Had her period 3 weeks ago. Started bleeding again 2 days ago and has been heavy at times with some clots. C/O cramping. Has not done HPT, but thinks it could be a miscarriage. Had mirena after her son, but had it taken out >3years ago. States she had abnormal bleeding during the time it was in and since she had it removed.  Past Medical History: No past medical history on file.  Past obstetric history: OB History  Gravida Para Term Preterm AB Living  1            SAB TAB Ectopic Multiple Live Births               # Outcome Date GA Lbr Len/2nd Weight Sex Delivery Anes PTL Lv  1 Current               Past Surgical History: No past surgical history on file.  Family History: Family History  Problem Relation Age of Onset  . Diabetes Father     Social History: Social History  Substance Use Topics  . Smoking status: Current Every Day Smoker    Packs/day: 0.50    Types: Cigarettes  . Smokeless tobacco: Not on file  . Alcohol use No    Allergies: No Known Allergies  Meds:  No prescriptions prior to admission.    I have reviewed patient's Past Medical Hx, Surgical Hx,  Family Hx, Social Hx, medications and allergies.  ROS:  Review of Systems  Constitutional: Negative for chills and fever.  Gastrointestinal: Positive for abdominal pain. Negative for constipation, diarrhea, nausea and vomiting.  Genitourinary: Positive for menstrual problem and vaginal bleeding. Negative for dysuria and vaginal discharge.  Musculoskeletal: Negative for back pain.  Neurological: Negative for dizziness.   Other systems negative     Physical Exam  Patient Vitals for the past 24 hrs:  BP Temp Temp src Pulse Resp  11/08/15 1320 127/81 98.8 F (37.1 C) Oral 101 18   Constitutional: Well-developed, well-nourished female in no acute distress.  Cardiovascular: normal rate and rhythm, no ectopy audible, S1 & S2 heard, no murmur Respiratory: normal effort, no distress. Lungs CTAB with no wheezes or crackles GI: Abd soft, non-tender.  Nondistended.  No rebound, No guarding.  Bowel Sounds audible  MS: Extremities nontender, no edema, normal ROM Neurologic: Alert and oriented x 4.   Grossly nonfocal. GU: Neg CVAT. Skin:  Warm and Dry Psych:  Affect appropriate.  PELVIC EXAM: Cervix pink, visually closed, without lesion, small amount of bloody discharge, vaginal walls and external genitalia normal Bimanual exam: Cervix firm, anterior, neg CMT, uterus nontender,  nonenlarged, adnexa without tenderness, enlargement, or mass    Labs: Results for orders placed or performed during the hospital encounter of 11/08/15 (from the past 24 hour(s))  Pregnancy, urine POC     Status: None   Collection Time: 11/08/15  1:16 PM  Result Value Ref Range   Preg Test, Ur NEGATIVE NEGATIVE      Imaging:  No results found.  MAU Course/MDM: I have ordered labs as follows: UPT Imaging ordered: none Results reviewed.   Pt stable at time of discharge.  Assessment: Dysfunctional Uterine bleeding Dysmenorrhea  Plan: Discharge home Recommend observation of cycles    Discussed normal  cycling and that it can be normal for them to vary from time to time Rx sent for ibuprofen for cramping.    Medication List    You have not been prescribed any medications.    Encouraged to return here or to other Urgent Care/ED if she develops worsening of symptoms, increase in pain, fever, or other concerning symptoms.   Hansel Feinstein CNM, MSN Certified Nurse-Midwife 11/08/2015 1:49 PM

## 2015-11-08 NOTE — Discharge Instructions (Signed)

## 2015-11-08 NOTE — MAU Note (Signed)
Had her period 3 weeks ago. Started bleeding again 2 days ago and has been heavy at times with some clots. C/O cramping. Has not done HPT, but thinks it could be a miscarriage. Had mirena after her son, but had it taken out >3years ago. States she had abnormal bleeding during the time it was in and since she had it removed.

## 2016-05-21 ENCOUNTER — Ambulatory Visit (HOSPITAL_COMMUNITY)
Admission: EM | Admit: 2016-05-21 | Discharge: 2016-05-21 | Disposition: A | Discharge status: Home / Self Care | Payer: Medicaid Other | Attending: Family Medicine | Admitting: Family Medicine

## 2016-05-21 ENCOUNTER — Encounter (HOSPITAL_COMMUNITY): Payer: Self-pay | Admitting: Emergency Medicine

## 2016-05-21 DIAGNOSIS — R112 Nausea with vomiting, unspecified: Secondary | ICD-10-CM | POA: Diagnosis not present

## 2016-05-21 DIAGNOSIS — G44209 Tension-type headache, unspecified, not intractable: Secondary | ICD-10-CM

## 2016-05-21 DIAGNOSIS — Z3202 Encounter for pregnancy test, result negative: Secondary | ICD-10-CM | POA: Diagnosis not present

## 2016-05-21 LAB — POCT PREGNANCY, URINE: Preg Test, Ur: NEGATIVE

## 2016-05-21 MED ORDER — KETOROLAC TROMETHAMINE 10 MG PO TABS: 10.0000 mg | ORAL_TABLET | Freq: Four times a day (QID) | ORAL | 0 refills | Status: DC | PRN

## 2016-05-21 MED ORDER — DEXAMETHASONE SODIUM PHOSPHATE 10 MG/ML IJ SOLN
INTRAMUSCULAR | Status: AC
  Filled 2016-05-21: qty 1

## 2016-05-21 MED ORDER — ONDANSETRON 4 MG PO TBDP: 4.0000 mg | ORAL_TABLET | Freq: Three times a day (TID) | ORAL | 0 refills | Status: DC | PRN

## 2016-05-21 MED ORDER — DEXAMETHASONE SODIUM PHOSPHATE 10 MG/ML IJ SOLN
10.0000 mg | Freq: Once | INTRAMUSCULAR | Status: AC
Start: 2016-05-21 — End: 2016-05-21
  Administered 2016-05-21: 10 mg via INTRAMUSCULAR

## 2016-05-21 MED ORDER — KETOROLAC TROMETHAMINE 60 MG/2ML IM SOLN
30.0000 mg | Freq: Once | INTRAMUSCULAR | Status: AC
Start: 2016-05-21 — End: 2016-05-21
  Administered 2016-05-21: 30 mg via INTRAMUSCULAR

## 2016-05-21 MED ORDER — KETOROLAC TROMETHAMINE 30 MG/ML IJ SOLN
INTRAMUSCULAR | Status: AC
  Filled 2016-05-21: qty 1

## 2016-05-21 MED ORDER — METOCLOPRAMIDE HCL 5 MG/ML IJ SOLN
INTRAMUSCULAR | Status: AC
  Filled 2016-05-21: qty 2

## 2016-05-21 MED ORDER — METOCLOPRAMIDE HCL 5 MG/ML IJ SOLN
5.0000 mg | Freq: Once | INTRAMUSCULAR | Status: AC
  Administered 2016-05-21: 5 mg via INTRAMUSCULAR

## 2016-05-21 NOTE — Discharge Instructions (Signed)
Your pregnancy test was negative. For your headache, you have been given injections of Toradol, Reglan, and dexamethasone. I have prescribed a 5 day course of Toradol tablets to take at home, take 1 tablet every 6 hours as needed for headache. This medication can cause problems with the kidneys and with the stomach, drinks lots of water while taking and do not take longer than 5 days. For Nausea, I have prescribed Zofran, take 1 tablet under the tongue every 8 hours as needed. Should your headache persist I would recommend you follow up with a primary care provider for further care and evaluation.

## 2016-05-21 NOTE — ED Provider Notes (Signed)
CSN: [id number]     Arrival date & time 05/21/16  1812 History   First MD Initiated Contact with Patient 05/21/16 1852     Chief Complaint  Patient presents with  . Headache   (Consider location/radiation/quality/duration/timing/severity/associated sxs/prior Treatment) 26 year old female presents to clinic with 4 day history of headache. States she has history of headaches, states this is not the worst she has had but is the longest headache she has had, denies fever, neck stiffness or rigidity, denies LOC or dizziness. She reports light sensitivity, pain is on the left side radiating from the front to the back. Reports throbbing pain. She has had nausea and vomiting. Last menstrual cycle 04/08/16, states she has taken pregnancy tests at home and they have been negative. She is not on birth control   The history is provided by the patient.    Past Medical History:  Diagnosis Date  . Abnormal uterine bleeding    Past Surgical History:  Procedure Laterality Date  . NO PAST SURGERIES     Family History  Problem Relation Age of Onset  . Diabetes Father    Social History  Substance Use Topics  . Smoking status: Current Every Day Smoker    Packs/day: 1.00    Types: Cigarettes  . Smokeless tobacco: Current User  . Alcohol use Yes   OB History    Gravida Para Term Preterm AB Living   1 1 1     1    SAB TAB Ectopic Multiple Live Births                 Review of Systems  Reason unable to perform ROS: as covered in HPI.  All other systems reviewed and are negative.   Allergies  Patient has no known allergies.  Home Medications   Prior to Admission medications   Medication Sig Start Date End Date Taking? Authorizing Provider  ibuprofen (ADVIL,MOTRIN) 600 MG tablet Take 1 tablet (600 mg total) by mouth every 6 (six) hours as needed. 11/08/15   Seabron Spates, CNM  ketorolac (TORADOL) 10 MG tablet Take 1 tablet (10 mg total) by mouth every 6 (six) hours as needed. 05/21/16    Barnet Glasgow, NP  ondansetron (ZOFRAN ODT) 4 MG disintegrating tablet Take 1 tablet (4 mg total) by mouth every 8 (eight) hours as needed for nausea or vomiting. 05/21/16   Barnet Glasgow, NP   Meds Ordered and Administered this Visit   Medications  ketorolac (TORADOL) injection 30 mg (30 mg Intramuscular Given 05/21/16 1922)  metoCLOPramide (REGLAN) injection 5 mg (5 mg Intramuscular Given 05/21/16 1921)  dexamethasone (DECADRON) injection 10 mg (10 mg Intramuscular Given 05/21/16 1922)    BP 120/77 (BP Location: Right Arm)   Pulse 86   Temp 99.6 F (37.6 C) (Oral)   Resp 17   Ht 5' (1.524 m)   Wt 120 lb (54.4 kg)   LMP 04/11/2016   SpO2 100%   BMI 23.44 kg/m  No data found.   Physical Exam  Constitutional: She is oriented to person, place, and time. She appears well-developed and well-nourished. No distress.  HENT:  Head: Normocephalic and atraumatic.  Right Ear: External ear normal.  Left Ear: External ear normal.  Nose: Nose normal.  Mouth/Throat: Oropharynx is clear and moist. No oropharyngeal exudate.  Eyes: EOM are normal. Pupils are equal, round, and reactive to light.  Neck: Normal range of motion. Neck supple. No JVD present. No neck rigidity. No Brudzinski's sign and  no Kernig's sign noted.  Cardiovascular: Normal rate and regular rhythm.   Pulmonary/Chest: Effort normal and breath sounds normal.  Lymphadenopathy:    She has no cervical adenopathy.  Neurological: She is alert and oriented to person, place, and time. No cranial nerve deficit. Coordination normal.  Skin: Skin is warm and dry. Capillary refill takes less than 2 seconds. She is not diaphoretic.  Psychiatric: She has a normal mood and affect.  Nursing note and vitals reviewed.   Urgent Care Course     Procedures (including critical care time)  Labs Review Labs Reviewed  POCT PREGNANCY, URINE    Imaging Review No results found.   Visual Acuity Review  Right Eye Distance:   Left Eye  Distance:   Bilateral Distance:    Right Eye Near:   Left Eye Near:    Bilateral Near:         MDM   1. Acute non intractable tension-type headache   Your pregnancy test was negative. For your headache, you have been given injections of Toradol, Reglan, and dexamethasone. I have prescribed a 5 day course of Toradol tablets to take at home, take 1 tablet every 6 hours as needed for headache. This medication can cause problems with the kidneys and with the stomach, drinks lots of water while taking and do not take longer than 5 days. For Nausea, I have prescribed Zofran, take 1 tablet under the tongue every 8 hours as needed. Should your headache persist I would recommend you follow up with a primary care provider for further care and evaluation.    Barnet Glasgow, NP 05/21/16 1950

## 2016-05-21 NOTE — ED Triage Notes (Signed)
Pt. Stated, I've been getting migraine headaches for about 4 days mostly at night.

## 2017-01-26 ENCOUNTER — Emergency Department (HOSPITAL_COMMUNITY)
Admission: EM | Admit: 2017-01-26 | Discharge: 2017-01-26 | Disposition: A | Discharge status: Home / Self Care | Payer: Self-pay | Attending: Emergency Medicine | Admitting: Emergency Medicine

## 2017-01-26 ENCOUNTER — Encounter (HOSPITAL_COMMUNITY): Payer: Self-pay | Admitting: Emergency Medicine

## 2017-01-26 DIAGNOSIS — F17210 Nicotine dependence, cigarettes, uncomplicated: Secondary | ICD-10-CM | POA: Insufficient documentation

## 2017-01-26 DIAGNOSIS — R05 Cough: Secondary | ICD-10-CM | POA: Insufficient documentation

## 2017-01-26 DIAGNOSIS — F17228 Nicotine dependence, chewing tobacco, with other nicotine-induced disorders: Secondary | ICD-10-CM | POA: Insufficient documentation

## 2017-01-26 DIAGNOSIS — M899 Disorder of bone, unspecified: Secondary | ICD-10-CM | POA: Insufficient documentation

## 2017-01-26 DIAGNOSIS — R059 Cough, unspecified: Secondary | ICD-10-CM

## 2017-01-26 MED ORDER — NAPROXEN 500 MG PO TABS: 500.0000 mg | ORAL_TABLET | Freq: Two times a day (BID) | ORAL | 0 refills | Status: DC

## 2017-01-26 MED ORDER — BENZONATATE 100 MG PO CAPS: 100.0000 mg | ORAL_CAPSULE | Freq: Three times a day (TID) | ORAL | 0 refills | Status: DC

## 2017-01-26 NOTE — ED Notes (Signed)
Pt departed in NAD, refused use of wheelchair.  

## 2017-01-26 NOTE — Discharge Instructions (Signed)
As discussed, take naproxen twice a day with food. Apply heat or ice as needed and monitor for any worsening. Follow up with your primary care provider in a week if symptoms persist. Return sooner if redness, warmth to touch, worsening swelling, pressure, purulence, fever, chills .

## 2017-01-26 NOTE — ED Triage Notes (Signed)
Patient noticed a lump at right anterior ribcage last night , painful when palpated , no swelling or drainage .

## 2017-01-26 NOTE — ED Provider Notes (Signed)
[facility] Provider Note   CSN: [id number] Arrival date & time: 01/26/17  2002     History   Chief Complaint Chief Complaint  Patient presents with  . Lump    HPI Shannon Wolfe is a 26 y.o. female presenting with raised bump on her anterior right lower rib cage which she noted yesterday. She reports that it is somewhat tender to the touch in discomfort is aggravated with twisting her rib cage or movement. She reports that she's been having a cold for the last couple of days and coughing forcefully prior to noticing this pain.she denies erythema, warmth, fever, chills or other symptoms. patient has not tried anything for discomfort.  HPI  Past Medical History:  Diagnosis Date  . Abnormal uterine bleeding     Patient Active Problem List   Diagnosis Date Noted  . 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 Establishing care with new doctor, encounter for 03/28/2014    Past Surgical History:  Procedure Laterality Date  . NO PAST SURGERIES      OB History    Gravida Para Term Preterm AB Living   1 1 1     1    SAB TAB Ectopic Multiple Live Births                   Home Medications    Prior to Admission medications   Medication Sig Start Date End Date Taking? Authorizing Provider  benzonatate (TESSALON) 100 MG capsule Take 1 capsule (100 mg total) by mouth every 8 (eight) hours. 01/26/17   Emeline General, PA-C  ibuprofen (ADVIL,MOTRIN) 600 MG tablet Take 1 tablet (600 mg total) by mouth every 6 (six) hours as needed. 11/08/15   Seabron Spates, CNM  ketorolac (TORADOL) 10 MG tablet Take 1 tablet (10 mg total) by mouth every 6 (six) hours as needed. 05/21/16   Barnet Glasgow, NP  naproxen (NAPROSYN) 500 MG tablet Take 1 tablet (500 mg total) by mouth 2 (two) times daily with a meal. 01/26/17   Avie Echevaria B, PA-C  ondansetron (ZOFRAN ODT) 4 MG disintegrating tablet Take 1 tablet (4 mg total) by mouth every 8 (eight) hours as  needed for nausea or vomiting. 05/21/16   Barnet Glasgow, NP    Family History Family History  Problem Relation Age of Onset  . Diabetes Father     Social History Social History  Substance Use Topics  . Smoking status: Current Every Day Smoker    Packs/day: 1.00    Types: Cigarettes  . Smokeless tobacco: Current User  . Alcohol use Yes     Allergies   Patient has no known allergies.   Review of Systems Review of Systems  Constitutional: Negative for chills and fever.  Respiratory: Negative for cough, chest tightness, shortness of breath, wheezing and stridor.   Cardiovascular: Negative for chest pain.       Entire right lower chest wall mass and tenderness.  Gastrointestinal: Negative for nausea and vomiting.  Musculoskeletal: Positive for myalgias. Negative for neck pain and neck stiffness.  Skin: Negative for color change, pallor, rash and wound.     Physical Exam Updated Vital Signs BP 126/87 (BP Location: Right Arm)   Pulse (!) 102   Temp 98.6 F (37 C) (Oral)   Resp 16   Ht 5' (1.524 m)   Wt 59 kg (130 lb)   LMP 01/05/2017   SpO2 99%   BMI 25.39 kg/m   Physical Exam  Constitutional: She appears well-developed and well-nourished. No distress.  Afebrile, nontoxic-appearing, sitting comfortably in bed in no acute distress.  HENT:  Head: Normocephalic and atraumatic.  Eyes: Conjunctivae are normal.  Neck: Neck supple.  Cardiovascular: Normal rate, regular rhythm and normal heart sounds.   No murmur heard. Pulmonary/Chest: Effort normal and breath sounds normal. No respiratory distress. She has no wheezes. She has no rales. She exhibits tenderness.  Soft and Movable 2.5 x 2.5 cm raised lump with some tenderness to palpation near the eighth and ninth rib. No erythema, induration or warmth to the touch.  Abdominal: Soft. She exhibits no distension. There is no tenderness.  Musculoskeletal: Normal range of motion. She exhibits no edema.  Neurological: She  is alert.  Skin: Skin is warm and dry. No rash noted. She is not diaphoretic. No erythema. No pallor.  Psychiatric: She has a normal mood and affect.  Nursing note and vitals reviewed.    ED Treatments / Results  Labs (all labs ordered are listed, but only abnormal results are displayed) Labs Reviewed - No data to display  EKG  EKG Interpretation None       Radiology No results found.  Procedures Procedures (including critical care time)  Medications Ordered in ED Medications - No data to display   Initial Impression / Assessment and Plan / ED Course  I have reviewed the triage vital signs and the nursing notes.  Pertinent labs & imaging results that were available during my care of the patient were reviewed by me and considered in my medical decision making (see chart for details).    Patient presenting with movable soft raised mass to the right lower rib cage. Clinically not concerning for abscess at this time. Patient reports noticing this after forceful coughing, possibly edema from intercostal strain or lipoma.  No deep tenderness to the ribs. No trauma or injury. Radiology not indicated at this time.   Provided reassurance and urged patient to monitor for any worsening and to return if experiencing erythema, worsening swelling and induration, warmth, fever or other concerning symptoms.  Discharge home with symptomatic relief and follow up with primary care in a week if symptoms persist.   Final Clinical Impressions(s) / ED Diagnoses   Final diagnoses:  Lump of rib  Cough    New Prescriptions Discharge Medication List as of 01/26/2017  9:16 PM    START taking these medications   Details  benzonatate (TESSALON) 100 MG capsule Take 1 capsule (100 mg total) by mouth every 8 (eight) hours., Starting Sun 01/26/2017, Print    naproxen (NAPROSYN) 500 MG tablet Take 1 tablet (500 mg total) by mouth 2 (two) times daily with a meal., Starting Sun 01/26/2017, Print          Avie Echevaria B, PA-C 01/26/17 2149    Merryl Hacker, MD 01/29/17 [phone]

## 2017-02-23 ENCOUNTER — Emergency Department (HOSPITAL_COMMUNITY): Payer: Self-pay

## 2017-02-23 ENCOUNTER — Encounter (HOSPITAL_COMMUNITY): Payer: Self-pay

## 2017-02-23 ENCOUNTER — Emergency Department (HOSPITAL_COMMUNITY)
Admission: EM | Admit: 2017-02-23 | Discharge: 2017-02-23 | Disposition: A | Discharge status: Home / Self Care | Payer: Self-pay | Attending: Emergency Medicine | Admitting: Emergency Medicine

## 2017-02-23 DIAGNOSIS — F17210 Nicotine dependence, cigarettes, uncomplicated: Secondary | ICD-10-CM | POA: Insufficient documentation

## 2017-02-23 DIAGNOSIS — R0789 Other chest pain: Secondary | ICD-10-CM | POA: Insufficient documentation

## 2017-02-23 LAB — I-STAT BETA HCG BLOOD, ED (MC, WL, AP ONLY): I-stat hCG, quantitative: <5 m[IU]/mL (ref <5)

## 2017-02-23 LAB — I-STAT CHEM 8, ED
BUN: 8 mg/dL (ref 6–20)
Calcium, Ion: 1.19 mmol/L (ref 1.15–1.40)
Chloride: 103 mmol/L (ref 101–111)
Creatinine, Ser: 0.6 mg/dL (ref 0.44–1.00)
Glucose, Bld: 113 mg/dL — ABNORMAL HIGH (ref 65–99)
HCT: 44 % (ref 36.0–46.0)
Hemoglobin: 15 g/dL (ref 12.0–15.0)
POTASSIUM: 3.4 mmol/L — AB (ref 3.5–5.1)
SODIUM: 140 mmol/L (ref 135–145)
TCO2: 26 mmol/L (ref 22–32)

## 2017-02-23 MED ORDER — IOPAMIDOL (ISOVUE-300) INJECTION 61%
INTRAVENOUS | Status: AC
  Administered 2017-02-23: 75 mL
  Filled 2017-02-23: qty 75

## 2017-02-23 NOTE — ED Notes (Signed)
Patient transported to X-ray 

## 2017-02-23 NOTE — ED Provider Notes (Signed)
[facility] EM[facility] DEPARTMENT Provider Note   CSN: [id number] Arrival date & time: 02/23/17  1208     History   Chief Complaint Chief Complaint  Patient presents with  . Mass    HPI Nazaria A Joya Gaskins is a 26 y.o. female.  The history is provided by the patient. No language interpreter was used.   Tamaya A Joya Gaskins is a 26 y.o. female who presents to the Emergency Department complaining of chest pain/swelling.  She reports about 1 month of pain and swelling to her right lower chest wall.  Symptoms started when she was feeling sick with a cough.  Overall she feels well but has constant pain in her right chest wall that is worse with palpation and movement.  No fevers, night sweats, cough, shortness of breath, abdominal pain, nausea, vomiting, weight loss. Past Medical History:  Diagnosis Date  . Abnormal uterine bleeding     Patient Active Problem List   Diagnosis Date Noted  . 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 Establishing care with new doctor, encounter for 03/28/2014    Past Surgical History:  Procedure Laterality Date  . NO PAST SURGERIES      OB History    Gravida Para Term Preterm AB Living   1 1 1     1    SAB TAB Ectopic Multiple Live Births                   Home Medications    Prior to Admission medications   Medication Sig Start Date End Date Taking? Authorizing Provider  benzonatate (TESSALON) 100 MG capsule Take 1 capsule (100 mg total) by mouth every 8 (eight) hours. 01/26/17   Emeline General, PA-C  ibuprofen (ADVIL,MOTRIN) 600 MG tablet Take 1 tablet (600 mg total) by mouth every 6 (six) hours as needed. 11/08/15   Seabron Spates, CNM  ketorolac (TORADOL) 10 MG tablet Take 1 tablet (10 mg total) by mouth every 6 (six) hours as needed. 05/21/16   Barnet Glasgow, NP  naproxen (NAPROSYN) 500 MG tablet Take 1 tablet (500 mg total) by mouth 2 (two) times daily with a meal. 01/26/17   Avie Echevaria B, PA-C  ondansetron (ZOFRAN  ODT) 4 MG disintegrating tablet Take 1 tablet (4 mg total) by mouth every 8 (eight) hours as needed for nausea or vomiting. 05/21/16   Barnet Glasgow, NP    Family History Family History  Problem Relation Age of Onset  . Diabetes Father     Social History Social History   Tobacco Use  . Smoking status: Current Every Day Smoker    Packs/day: 1.00    Types: Cigarettes  . Smokeless tobacco: Current User  Substance Use Topics  . Alcohol use: Yes  . Drug use: Yes    Types: Marijuana     Allergies   Patient has no known allergies.   Review of Systems Review of Systems  All other systems reviewed and are negative.    Physical Exam Updated Vital Signs BP 126/84   Pulse 91   Temp 97.8 F (36.6 C) (Oral)   Resp 16   Ht 4' 11.5" (1.511 m)   Wt 59 kg (130 lb)   LMP 01/31/2017 (Approximate)   SpO2 98%   BMI 25.82 kg/m   Physical Exam  Constitutional: She is oriented to person, place, and time. She appears well-developed and well-nourished.  HENT:  Head: Normocephalic and atraumatic.  Cardiovascular: Normal rate and regular rhythm.  No murmur heard.  Pulmonary/Chest: Effort normal and breath sounds normal. No respiratory distress.  Abdominal: Soft. There is no tenderness. There is no rebound and no guarding.  Musculoskeletal: She exhibits no edema.  Right lower anterior costal margin with firm and tender nonmobile mass that is approximately 2-3 cm in length.  Neurological: She is alert and oriented to person, place, and time.  Skin: Skin is warm and dry.  Psychiatric: She has a normal mood and affect. Her behavior is normal.  Nursing note and vitals reviewed.    ED Treatments / Results  Labs (all labs ordered are listed, but only abnormal results are displayed) Labs Reviewed  I-STAT CHEM 8, ED - Abnormal; Notable for the following components:      Result Value   Potassium 3.4 (*)    Glucose, Bld 113 (*)    All other components within normal limits  I-STAT  BETA HCG BLOOD, ED ([facility], [facility], [facility] ONLY)    EKG  EKG Interpretation None       Radiology Dg Ribs Unilateral W/chest Right  Result Date: 02/23/2017 CLINICAL DATA:  Right-sided chest pain for 1 month, no known injury, initial encounter EXAM: RIGHT RIBS AND CHEST - 3+ VIEW COMPARISON:  03/18/2014 FINDINGS: Cardiac shadow is within normal limits. Mild scarring is noted in the left mid lung stable from the prior study. Lungs are otherwise well aerated without focal infiltrate or sizable effusion. No pneumothorax is noted. No acute rib fracture is identified. IMPRESSION: No acute abnormality noted. Electronically Signed   By: Inez Catalina M.D.   On: 02/23/2017 16:37   Ct Chest W Contrast  Result Date: 02/23/2017 CLINICAL DATA:  Lump on right side in region of tenth, eleventh, and twelfth ribs. EXAM: CT CHEST WITH CONTRAST TECHNIQUE: Multidetector CT imaging of the chest was performed during intravenous contrast administration. CONTRAST:  68mL ISOVUE-300 IOPAMIDOL (ISOVUE-300) INJECTION 61% COMPARISON:  Rib films from earlier today FINDINGS: Cardiovascular: No significant vascular findings. Normal heart size. No pericardial effusion. Mediastinum/Nodes: No enlarged mediastinal, hilar, or axillary lymph nodes. Thyroid gland, trachea, and esophagus demonstrate no significant findings. Lungs/Pleura: Subsegmental atelectasis in the lingula, right middle lobe, and dependent portions of the lung bases. No nodules, masses, or suspicious infiltrate. The central airways are normal. No pneumothorax. Upper Abdomen: No acute abnormality. Musculoskeletal: The site of the patient's painful lump is marked on series 8, image 113 with a BB. No underlying soft tissue abnormalities identified. Superficial soft tissues are otherwise normal. The underlying bones are normal. No bony abnormalities. IMPRESSION: 1. No cause for the patient's symptoms identified. 2. Scattered subsegmental atelectasis as above. No acute abnormalities.  Electronically Signed   By: Dorise Bullion III M.D   On: 02/23/2017 18:35    Procedures Procedures (including critical care time)  Medications Ordered in ED Medications  iopamidol (ISOVUE-300) 61 % injection (75 mLs  Contrast Given 02/23/17 1744)     Initial Impression / Assessment and Plan / ED Course  I have reviewed the triage vital signs and the nursing notes.  Pertinent labs & imaging results that were available during my care of the patient were reviewed by me and considered in my medical decision making (see chart for details).     Patient here for evaluation of 1 month of swelling and pain to the right anterior chest wall.  There is tenderness and there is a palpable mass in this area with no overlying evidence of infection.  X-ray is negative for rib fracture or deformity.  CT chest obtained  to further evaluate.  CT is negative for any acute abnormality or mass.  Question if this is related to muscle spasm in the area.  Discussed with patient home care with ibuprofen, rest.  Discussed outpatient follow-up and return precautions.  Final Clinical Impressions(s) / ED Diagnoses   Final diagnoses:  Chest wall pain    ED Discharge Orders    None       Quintella Reichert, MD 02/23/17 2015

## 2017-02-23 NOTE — ED Notes (Signed)
Patient transported to CT 

## 2017-02-23 NOTE — ED Triage Notes (Signed)
Per Pt, Pt is coming from home with complaints of lump on her right rib cage that started about a month ago. Pt was seen and sent home one month ago. Pt reports that it is starting to hurt now. No Xray was done last examination.

## 2017-02-23 NOTE — ED Notes (Signed)
ED Provider at bedside. 

## 2017-02-23 NOTE — Discharge Instructions (Signed)
You can take ibuprofen, available over-the-counter as needed for pain.  Get rechecked immediately if you develop fevers or new concerning symptoms.

## 2017-05-07 ENCOUNTER — Encounter (HOSPITAL_COMMUNITY): Payer: Self-pay

## 2017-05-07 ENCOUNTER — Emergency Department (HOSPITAL_COMMUNITY)
Admission: EM | Admit: 2017-05-07 | Discharge: 2017-05-07 | Disposition: A | Discharge status: Home / Self Care | Payer: Medicaid Other | Attending: Emergency Medicine | Admitting: Emergency Medicine

## 2017-05-07 ENCOUNTER — Other Ambulatory Visit: Payer: Self-pay

## 2017-05-07 DIAGNOSIS — R222 Localized swelling, mass and lump, trunk: Secondary | ICD-10-CM | POA: Insufficient documentation

## 2017-05-07 DIAGNOSIS — F17210 Nicotine dependence, cigarettes, uncomplicated: Secondary | ICD-10-CM | POA: Insufficient documentation

## 2017-05-07 DIAGNOSIS — IMO0002 Reserved for concepts with insufficient information to code with codable children: Secondary | ICD-10-CM

## 2017-05-07 DIAGNOSIS — R229 Localized swelling, mass and lump, unspecified: Secondary | ICD-10-CM

## 2017-05-07 NOTE — ED Provider Notes (Signed)
[facility] EM[facility] DEPART[facility] Provider Note   CSN: [id number] Arrival date & time: 05/07/17  1244     History   Chief Complaint No chief complaint on file.   HPI Shannon Wolfe is a 27 y.o. female.  HPI   27 year old female presents today with complaints of lumps.  Patient notes that for several months she has had a lump on her right lower chest wall.  She notes this is tender and painful with breathing.  She notes she is also developed some other areas along the upper abdomen and chest with similar findings.  Patient denies any fever, weight loss, or history of the same.  She has been seen numerous times for similar symptoms, but did not follow-up as an outpatient for reevaluation.  Patient reports Tylenol does not improve her symptoms.  Past Medical History:  Diagnosis Date  . Abnormal uterine bleeding     Patient Active Problem List   Diagnosis Date Noted  . 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 Establishing care with new doctor, encounter for 03/28/2014    Past Surgical History:  Procedure Laterality Date  . NO PAST SURGERIES      OB History    Gravida Para Term Preterm AB Living   1 1 1     1    SAB TAB Ectopic Multiple Live Births                   Home Medications    Prior to Admission medications   Medication Sig Start Date End Date Taking? Authorizing Provider  albuterol (PROVENTIL HFA;VENTOLIN HFA) 108 (90 Base) MCG/ACT inhaler Inhale 2 puffs into the lungs every 6 (six) hours as needed for wheezing or shortness of breath.   Yes [provider]  ibuprofen (ADVIL,MOTRIN) 200 MG tablet Take 600-1,000 mg by mouth every 6 (six) hours as needed for moderate pain.    Yes [provider]  benzonatate (TESSALON) 100 MG capsule Take 1 capsule (100 mg total) by mouth every 8 (eight) hours. Patient not taking: Reported on 05/07/2017 01/26/17   Emeline General, PA-C  ibuprofen (ADVIL,MOTRIN) 600 MG tablet Take 1 tablet (600 mg  total) by mouth every 6 (six) hours as needed. Patient not taking: Reported on 05/07/2017 11/08/15   Seabron Spates, CNM  ketorolac (TORADOL) 10 MG tablet Take 1 tablet (10 mg total) by mouth every 6 (six) hours as needed. Patient not taking: Reported on 05/07/2017 05/21/16   Barnet Glasgow, NP  naproxen (NAPROSYN) 500 MG tablet Take 1 tablet (500 mg total) by mouth 2 (two) times daily with a meal. Patient not taking: Reported on 05/07/2017 01/26/17   Avie Echevaria B, PA-C  ondansetron (ZOFRAN ODT) 4 MG disintegrating tablet Take 1 tablet (4 mg total) by mouth every 8 (eight) hours as needed for nausea or vomiting. Patient not taking: Reported on 05/07/2017 05/21/16   Barnet Glasgow, NP    Family History Family History  Problem Relation Age of Onset  . Diabetes Father     Social History Social History   Tobacco Use  . Smoking status: Current Every Day Smoker    Packs/day: 1.00    Types: Cigarettes  . Smokeless tobacco: Current User  Substance Use Topics  . Alcohol use: Yes  . Drug use: Yes    Types: Marijuana     Allergies   Patient has no known allergies.   Review of Systems Review of Systems  All other systems reviewed and are negative.  Physical Exam Updated Vital Signs BP 118/80 (BP Location: Left Arm)   Pulse 93   Temp 98.1 F (36.7 C) (Oral)   Resp 18   SpO2 97%   Physical Exam  Constitutional: She is oriented to person, place, and time. She appears well-developed and well-nourished.  HENT:  Head: Normocephalic and atraumatic.  Eyes: Conjunctivae are normal. Pupils are equal, round, and reactive to light. Right eye exhibits no discharge. Left eye exhibits no discharge. No scleral icterus.  Neck: Normal range of motion. No JVD present. No tracheal deviation present.  Pulmonary/Chest: Effort normal. No stridor.  3 cm soft mobile mass at the right lower chest wall-tenderness to palpation of the sternal region  Neurological: She is alert and oriented  to person, place, and time. Coordination normal.  Psychiatric: She has a normal mood and affect. Her behavior is normal. Judgment and thought content normal.  Nursing note and vitals reviewed.    ED Treatments / Results  Labs (all labs ordered are listed, but only abnormal results are displayed) Labs Reviewed - No data to display  EKG  EKG Interpretation None       Radiology No results found.  Procedures Procedures (including critical care time)  Medications Ordered in ED Medications - No data to display   Initial Impression / Assessment and Plan / ED Course  I have reviewed the triage vital signs and the nursing notes.  Pertinent labs & imaging results that were available during my care of the patient were reviewed by me and considered in my medical decision making (see chart for details).      Final Clinical Impressions(s) / ED Diagnoses   Final diagnoses:  Mass    Patient presents with mass today.  Patient has had numerous evaluations, most recently CT with no acute findings.  I have high suspicion for lipoma in this patient.  Patient has been instructed to follow-up as an outpatient but did not do this.  I highly encouraged her to follow-up for reassessment, she is given strict return precautions, she verbalized understanding and agreement to today's plan had no further questions or concerns at the time of discharge.  ED Discharge Orders    None       Francee Gentile 05/07/17 2049    Jola Schmidt, MD 05/08/17 1101

## 2017-05-07 NOTE — ED Triage Notes (Signed)
Pt reports "lumps" under skin on ribcage.

## 2017-05-07 NOTE — Discharge Instructions (Signed)
Please read attached information. If you experience any new or worsening signs or symptoms please return to the emergency room for evaluation. Please follow-up with your primary care provider or specialist as discussed.  °

## 2017-05-09 ENCOUNTER — Ambulatory Visit (INDEPENDENT_AMBULATORY_CARE_PROVIDER_SITE_OTHER): Payer: Self-pay | Admitting: Family Medicine

## 2017-05-09 VITALS — Wt 139.0 lb

## 2017-05-09 DIAGNOSIS — Z7689 Persons encountering health services in other specified circumstances: Secondary | ICD-10-CM

## 2017-05-10 NOTE — Progress Notes (Signed)
Patient left prior to establishing care. No interview or assessment performed.   Donia Pounds  MSN, FNP-C [facility] [address] [place], [place] [address] [phone]

## 2017-05-31 ENCOUNTER — Emergency Department (HOSPITAL_COMMUNITY): Payer: Medicaid Other

## 2017-05-31 ENCOUNTER — Encounter (HOSPITAL_COMMUNITY): Payer: Self-pay | Admitting: Emergency Medicine

## 2017-05-31 ENCOUNTER — Inpatient Hospital Stay (HOSPITAL_COMMUNITY)
Admission: EM | Admit: 2017-05-31 | Discharge: 2017-06-02 | DRG: 158 | Disposition: A | Discharge status: Home / Self Care | Payer: Medicaid Other | Attending: Internal Medicine | Admitting: Internal Medicine

## 2017-05-31 DIAGNOSIS — F17210 Nicotine dependence, cigarettes, uncomplicated: Secondary | ICD-10-CM | POA: Diagnosis present

## 2017-05-31 DIAGNOSIS — L03211 Cellulitis of face: Secondary | ICD-10-CM | POA: Diagnosis present

## 2017-05-31 DIAGNOSIS — L03221 Cellulitis of neck: Secondary | ICD-10-CM | POA: Diagnosis present

## 2017-05-31 DIAGNOSIS — D72828 Other elevated white blood cell count: Secondary | ICD-10-CM | POA: Diagnosis not present

## 2017-05-31 DIAGNOSIS — T380X5A Adverse effect of glucocorticoids and synthetic analogues, initial encounter: Secondary | ICD-10-CM | POA: Diagnosis not present

## 2017-05-31 DIAGNOSIS — K122 Cellulitis and abscess of mouth: Principal | ICD-10-CM | POA: Diagnosis present

## 2017-05-31 DIAGNOSIS — K047 Periapical abscess without sinus: Secondary | ICD-10-CM | POA: Diagnosis present

## 2017-05-31 DIAGNOSIS — K029 Dental caries, unspecified: Secondary | ICD-10-CM | POA: Diagnosis present

## 2017-05-31 LAB — I-STAT CHEM 8, ED
BUN: 14 mg/dL (ref 6–20)
CHLORIDE: 106 mmol/L (ref 101–111)
Calcium, Ion: 1.11 mmol/L — ABNORMAL LOW (ref 1.15–1.40)
Creatinine, Ser: 0.6 mg/dL (ref 0.44–1.00)
GLUCOSE: 108 mg/dL — AB (ref 65–99)
HEMATOCRIT: 42 % (ref 36.0–46.0)
Hemoglobin: 14.3 g/dL (ref 12.0–15.0)
POTASSIUM: 4 mmol/L (ref 3.5–5.1)
SODIUM: 139 mmol/L (ref 135–145)
TCO2: 22 mmol/L (ref 22–32)

## 2017-05-31 LAB — CBC
HCT: 40.8 % (ref 36.0–46.0)
Hemoglobin: 14.1 g/dL (ref 12.0–15.0)
MCH: 31.8 pg (ref 26.0–34.0)
MCHC: 34.6 g/dL (ref 30.0–36.0)
MCV: 92.1 fL (ref 78.0–100.0)
PLATELETS: 341 10*3/uL (ref 150–400)
RBC: 4.43 MIL/uL (ref 3.87–5.11)
RDW: 13.2 % (ref 11.5–15.5)
WBC: 17.2 10*3/uL — AB (ref 4.0–10.5)

## 2017-05-31 LAB — I-STAT BETA HCG BLOOD, ED (MC, WL, AP ONLY)

## 2017-05-31 LAB — BASIC METABOLIC PANEL
Anion gap: 11 (ref 5–15)
BUN: 12 mg/dL (ref 6–20)
CALCIUM: 9 mg/dL (ref 8.9–10.3)
CO2: 20 mmol/L — ABNORMAL LOW (ref 22–32)
Chloride: 107 mmol/L (ref 101–111)
Creatinine, Ser: 0.6 mg/dL (ref 0.44–1.00)
GFR calc non Af Amer: >60 mL/min (ref >60)
Glucose, Bld: 106 mg/dL — ABNORMAL HIGH (ref 65–99)
POTASSIUM: 4.1 mmol/L (ref 3.5–5.1)
SODIUM: 138 mmol/L (ref 135–145)

## 2017-05-31 MED ORDER — NICOTINE 14 MG/24HR TD PT24
14.0000 mg | MEDICATED_PATCH | Freq: Once | TRANSDERMAL | Status: AC
  Administered 2017-06-01: 14 mg via TRANSDERMAL
  Filled 2017-05-31: qty 1

## 2017-05-31 MED ORDER — MORPHINE SULFATE (PF) 4 MG/ML IV SOLN
4.0000 mg | Freq: Once | INTRAVENOUS | Status: AC
Start: 2017-05-31 — End: 2017-05-31
  Administered 2017-05-31: 4 mg via INTRAVENOUS
  Filled 2017-05-31: qty 1

## 2017-05-31 MED ORDER — CLINDAMYCIN PHOSPHATE 600 MG/50ML IV SOLN
600.0000 mg | Freq: Once | INTRAVENOUS | Status: AC
  Administered 2017-05-31: 600 mg via INTRAVENOUS
  Filled 2017-05-31: qty 50

## 2017-05-31 MED ORDER — IOPAMIDOL (ISOVUE-300) INJECTION 61%
INTRAVENOUS | Status: AC
  Administered 2017-05-31: 75 mL
  Filled 2017-05-31: qty 75

## 2017-05-31 NOTE — ED Provider Notes (Signed)
[facility]PART[facility] Provider Note   CSN: [id number] Arrival date & time: 05/31/17  1807     History   Chief Complaint Chief Complaint  Patient presents with  . Dental Pain    HPI Shannon Wolfe is a 27 y.o. female presenting for evaluation of tooth pain.   Pt states she developed pain of the R lower tooth yesterday, improved with ibuprofen. Today, pt had significant swelling of the R lower jaw, with pain extending into her neck. She has taken ibuprofen with minimal improvement. She denies problems with her tooth prior. She denies difficulty breathing or swallowing.  There is no lip, tongue, or throat swelling.  There is pain with extension of her neck and trismus. She denies HA, vision changes, CP, SOB, N/V, abd pain. She has no other medical problems, does not take medications daily. She denies immunocompromised state or h/o DM. She does not have a dentist.   HPI  Past Medical History:  Diagnosis Date  . Abnormal uterine bleeding     Patient Active Problem List   Diagnosis Date Noted  . 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 Establishing care with new doctor, encounter for 03/28/2014    Past Surgical History:  Procedure Laterality Date  . NO PAST SURGERIES      OB History    Gravida Para Term Preterm AB Living   1 1 1     1    SAB TAB Ectopic Multiple Live Births                   Home Medications    Prior to Admission medications   Medication Sig Start Date End Date Taking? Authorizing Provider  ibuprofen (ADVIL,MOTRIN) 200 MG tablet Take 600-1,000 mg by mouth every 6 (six) hours as needed for moderate pain.    Yes [provider]    Family History Family History  Problem Relation Age of Onset  . Diabetes Father     Social History Social History   Tobacco Use  . Smoking status: Current Every Day Smoker    Packs/day: 1.00    Types: Cigarettes  . Smokeless tobacco: Current User  Substance Use Topics  . Alcohol use:  Yes  . Drug use: Yes    Types: Marijuana     Allergies   Patient has no known allergies.   Review of Systems Review of Systems  HENT: Positive for dental problem and facial swelling.   Musculoskeletal: Positive for neck pain.  All other systems reviewed and are negative.    Physical Exam Updated Vital Signs BP 98/68   Pulse 88   Temp 98.4 F (36.9 C) (Oral)   Resp 18   LMP 04/17/2017   SpO2 94%   Physical Exam  Constitutional: She is oriented to person, place, and time. She appears well-developed and well-nourished. No distress.  HENT:  Head: Normocephalic.  Mouth/Throat: There is trismus in the jaw. Abnormal dentition. Dental caries present.    Obvious broken tooth and decay of right lower molar.  Trismus.  Patient handling secretions easily and no sign of airway compromise.  Tenderness palpation under the tongue and along the jaw/neck.  Swelling of the right lower jaw into the neck.  Eyes: EOM are normal.  Neck:  TTP of R side neck  Cardiovascular: Normal rate, regular rhythm and intact distal pulses.  Pulmonary/Chest: Effort normal and breath sounds normal.  Abdominal: Soft. She exhibits no distension. There is no tenderness. There is no guarding.  Musculoskeletal: Normal range of motion.  Neurological: She is alert and oriented to person, place, and time.  Skin: Skin is warm. No rash noted.  Psychiatric: She has a normal mood and affect.  Nursing note and vitals reviewed.   ED Treatments / Results  Labs (all labs ordered are listed, but only abnormal results are displayed) Labs Reviewed  CBC - Abnormal; Notable for the following components:      Result Value   WBC 17.2 (*)    All other components within normal limits  BASIC METABOLIC PANEL - Abnormal; Notable for the following components:   CO2 20 (*)    Glucose, Bld 106 (*)    All other components within normal limits  I-STAT CHEM 8, ED - Abnormal; Notable for the following components:   Glucose, Bld  108 (*)    Calcium, Ion 1.11 (*)    All other components within normal limits  I-STAT BETA HCG BLOOD, ED ([facility], [facility], [facility] ONLY)    EKG  EKG Interpretation None       Radiology Ct Soft Tissue Neck W Contrast  Result Date: 05/31/2017 CLINICAL DATA:  New onset right lower dental pain beginning yesterday. Neck pain, infection suspected. EXAM: CT NECK WITH CONTRAST TECHNIQUE: Multidetector CT imaging of the neck was performed using the standard protocol following the bolus administration of intravenous contrast. CONTRAST:  59mL ISOVUE-300 IOPAMIDOL (ISOVUE-300) INJECTION 61% COMPARISON:  None. FINDINGS: Pharynx and larynx: Mild prominence of the palatine tonsils is noted bilaterally. No focal mucosal or submucosal lesions are present. Vocal cords are midline and symmetric. Trachea is unremarkable. Salivary glands: The submandibular and parotid glands are normal bilaterally. Thyroid: Normal Lymph nodes: No significant adenopathy is present. Level 2 nodes are within normal limits bilaterally. Vascular: No significant vascular lesions are present. Limited intracranial: Within normal limits. Visualized orbits: Unremarkable. Mastoids and visualized paranasal sinuses: Clear Skeleton: Vertebral body heights alignment are normal. No focal lytic or blastic lesions are present. Upper chest: The lung apices are clear. No focal nodule or mass lesion is present. Other: A prominent dental caries is present in the first right maxillary molar (tooth 30). Periapical lucency is associated. Smaller dental caries are present in the second right mandibular molar and first left mandibular molar. A small dental caries is present within the first right maxillary molar. There is prominent periapical lucency about the roots of this tooth. Right-sided inflammatory changes appear to be centered at the mandible, likely associated with the first molar. Inflammatory changes extend in the submandibular space. There is thickening the platysma  and stranding into the subcutaneous space. IMPRESSION: 1. Right-sided dental caries and periapical abscess associated with the first maxillary and first mandibular molars on the right. 2. Right-sided soft tissue swelling appears to be centered about the right first mandibular molar and periapical abscess. No discrete soft tissue abscess is evident. 3. Other smaller dental caries are noted. Electronically Signed   By: San Morelle M.D.   On: 05/31/2017 22:00    Procedures Procedures (including critical care time)  Medications Ordered in ED Medications  nicotine (NICODERM CQ - dosed in mg/24 hours) patch 14 mg (not administered)  clindamycin (CLEOCIN) IVPB 600 mg (not administered)  morphine 4 MG/ML injection 4 mg (not administered)  dexamethasone (DECADRON) injection 8 mg (not administered)  clindamycin (CLEOCIN) IVPB 600 mg (0 mg Intravenous Stopped 05/31/17 2220)  morphine 4 MG/ML injection 4 mg (4 mg Intravenous Given 05/31/17 2045)  iopamidol (ISOVUE-300) 61 % injection (75 mLs  Contrast  Given 05/31/17 2129)     Initial Impression / Assessment and Plan / ED Course  I have reviewed the triage vital signs and the nursing notes.  Pertinent labs & imaging results that were available during my care of the patient were reviewed by me and considered in my medical decision making (see chart for details).     Patient presenting for evaluation of dental pain and facial swelling.  Physical exam shows a patient who appears in no distress.  She is not tachycardic and is afebrile.  Handling secretions easily and no airway compromise.  Concerning signs of facial swelling with extension into the neck, pain under the tongue, and trismus.  Will obtain labs and CT for further evaluation.  Morphine for pain and IV clindamycin started.  Labs show elevated white count at 17.  Otherwise reassuring.  CT shows multiple periapical abscesses and inflammation of the submandibular tissue.  Platysmal thickening  with subcutaneous stranding.  Concerning signs for early Ludwig's.  Discussed with attending, Dr. Laverta Baltimore agrees to plan.  Will admit to hospitalist service.  Discussed with Dr. Aggie Moats from hospitalist service, he is concerned that if patient decompensates, we do not have a dentist on call tomorrow.  Will contact ENT to see if they are comfortable managing this patient.  Discussed with Dr. Redmond Baseman from ENT.  He states that if patient develops airway compromise, they can help manage this.  However, they will not perform dental procedures.  Will follow with patient while in the hospital.  Discussed with Dr. Aggie Moats from [facility].  Patient to be admitted.  Patient requesting nicotine patch for agitation.    Final Clinical Impressions(s) / ED Diagnoses   Final diagnoses:  [address] angina    ED Discharge Orders    None       Franchot Heidelberg, PA-C 06/01/17 0011    Margette Fast, MD 06/01/17 1004

## 2017-05-31 NOTE — ED Triage Notes (Signed)
Pt to ER for evaluation of right lower dental pain onset yesterday, states swelling onset this morning. Pain relieved with ibuprofen. NAD

## 2017-06-01 ENCOUNTER — Other Ambulatory Visit: Payer: Self-pay

## 2017-06-01 DIAGNOSIS — T380X5A Adverse effect of glucocorticoids and synthetic analogues, initial encounter: Secondary | ICD-10-CM | POA: Diagnosis not present

## 2017-06-01 DIAGNOSIS — L03211 Cellulitis of face: Secondary | ICD-10-CM | POA: Diagnosis present

## 2017-06-01 DIAGNOSIS — Z720 Tobacco use: Secondary | ICD-10-CM

## 2017-06-01 DIAGNOSIS — K122 Cellulitis and abscess of mouth: Secondary | ICD-10-CM | POA: Diagnosis not present

## 2017-06-01 DIAGNOSIS — K047 Periapical abscess without sinus: Secondary | ICD-10-CM | POA: Diagnosis present

## 2017-06-01 DIAGNOSIS — L03221 Cellulitis of neck: Secondary | ICD-10-CM | POA: Diagnosis present

## 2017-06-01 DIAGNOSIS — F17210 Nicotine dependence, cigarettes, uncomplicated: Secondary | ICD-10-CM | POA: Diagnosis present

## 2017-06-01 DIAGNOSIS — K029 Dental caries, unspecified: Secondary | ICD-10-CM | POA: Diagnosis present

## 2017-06-01 DIAGNOSIS — D72828 Other elevated white blood cell count: Secondary | ICD-10-CM | POA: Diagnosis not present

## 2017-06-01 LAB — BASIC METABOLIC PANEL
Anion gap: 10 (ref 5–15)
BUN: 12 mg/dL (ref 6–20)
CHLORIDE: 105 mmol/L (ref 101–111)
CO2: 20 mmol/L — ABNORMAL LOW (ref 22–32)
CREATININE: 0.6 mg/dL (ref 0.44–1.00)
Calcium: 8.6 mg/dL — ABNORMAL LOW (ref 8.9–10.3)
GFR calc Af Amer: >60 mL/min (ref >60)
GFR calc non Af Amer: >60 mL/min (ref >60)
GLUCOSE: 96 mg/dL (ref 65–99)
Potassium: 3.8 mmol/L (ref 3.5–5.1)
Sodium: 135 mmol/L (ref 135–145)

## 2017-06-01 LAB — CBC
HCT: 40.2 % (ref 36.0–46.0)
Hemoglobin: 13.5 g/dL (ref 12.0–15.0)
MCH: 31 pg (ref 26.0–34.0)
MCHC: 33.6 g/dL (ref 30.0–36.0)
MCV: 92.4 fL (ref 78.0–100.0)
PLATELETS: 322 10*3/uL (ref 150–400)
RBC: 4.35 MIL/uL (ref 3.87–5.11)
RDW: 13.4 % (ref 11.5–15.5)
WBC: 15.2 10*3/uL — ABNORMAL HIGH (ref 4.0–10.5)

## 2017-06-01 LAB — HIV ANTIBODY (ROUTINE TESTING W REFLEX): HIV Screen 4th Generation wRfx: NONREACTIVE

## 2017-06-01 MED ORDER — MORPHINE SULFATE (PF) 4 MG/ML IV SOLN: 4.0000 mg | INTRAVENOUS | Status: DC | PRN

## 2017-06-01 MED ORDER — ONDANSETRON HCL 4 MG/2ML IJ SOLN: 4.0000 mg | Freq: Four times a day (QID) | INTRAMUSCULAR | Status: DC | PRN

## 2017-06-01 MED ORDER — ACETAMINOPHEN 650 MG RE SUPP: 650.0000 mg | Freq: Four times a day (QID) | RECTAL | Status: DC | PRN

## 2017-06-01 MED ORDER — DEXAMETHASONE SODIUM PHOSPHATE 10 MG/ML IJ SOLN
8.0000 mg | Freq: Four times a day (QID) | INTRAMUSCULAR | Status: DC
  Administered 2017-06-01 – 2017-06-02 (×3): 8 mg via INTRAVENOUS
  Filled 2017-06-01 (×4): qty 1

## 2017-06-01 MED ORDER — ACETAMINOPHEN 325 MG PO TABS: 650.0000 mg | ORAL_TABLET | Freq: Four times a day (QID) | ORAL | Status: DC | PRN

## 2017-06-01 MED ORDER — HEPARIN SODIUM (PORCINE) 5000 UNIT/ML IJ SOLN
5000.0000 [IU] | Freq: Three times a day (TID) | INTRAMUSCULAR | Status: DC
  Filled 2017-06-01: qty 1

## 2017-06-01 MED ORDER — ONDANSETRON HCL 4 MG PO TABS: 4.0000 mg | ORAL_TABLET | Freq: Four times a day (QID) | ORAL | Status: DC | PRN

## 2017-06-01 MED ORDER — KETOROLAC TROMETHAMINE 30 MG/ML IJ SOLN
30.0000 mg | Freq: Once | INTRAMUSCULAR | Status: AC
  Administered 2017-06-01: 30 mg via INTRAVENOUS
  Filled 2017-06-01: qty 1

## 2017-06-01 MED ORDER — NICOTINE 14 MG/24HR TD PT24
14.0000 mg | MEDICATED_PATCH | Freq: Every day | TRANSDERMAL | Status: DC
  Filled 2017-06-01: qty 1

## 2017-06-01 MED ORDER — CLINDAMYCIN PHOSPHATE 600 MG/50ML IV SOLN
600.0000 mg | Freq: Three times a day (TID) | INTRAVENOUS | Status: DC
  Administered 2017-06-02: 600 mg via INTRAVENOUS
  Filled 2017-06-01 (×2): qty 50

## 2017-06-01 NOTE — H&P (Signed)
[facility] History and Physical  Shannon Wolfe WSF:[id number] DOB: 02-May-1990 DOA: 05/31/2017  Referring physician:  PCP: Patient, No Pcp Per   Chief Complaint: "It got like this today."  HPI: Shannon Wolfe is a 27 y.o. female with past medical history of uterine bleeding presents to the hospital with facial swelling.  Patient states that recently she had a crown come off of a right-sided molar.  Went to see dental care but was unable to afford it.  2/23 acutely in the morning patient developed facial swelling.  Began to progress along with some redness.  No symptoms of airway compromise.  Patient alarmed came to the emergency room for evaluation.  ED course: Patient given a dose of Cleocin.  ENT called and they will follow patient if decompensation occurs.  Hospitalist consulted for admission.   Review of Systems:  As per HPI otherwise 10 point review of systems negative.    Past Medical History:  Diagnosis Date  . Abnormal uterine bleeding    Past Surgical History:  Procedure Laterality Date  . NO PAST SURGERIES     Social History:  reports that she has been smoking cigarettes.  She has been smoking about 1.00 pack per day. She uses smokeless tobacco. She reports that she drinks alcohol. She reports that she uses drugs. Drug: Marijuana.  No Known Allergies  Family History  Problem Relation Age of Onset  . Diabetes Father      Prior to Admission medications   Medication Sig Start Date End Date Taking? Authorizing Provider  ibuprofen (ADVIL,MOTRIN) 200 MG tablet Take 600-1,000 mg by mouth every 6 (six) hours as needed for moderate pain.    Yes [provider]   Physical Exam: Vitals:   05/31/17 1820 05/31/17 2307 05/31/17 2308  BP: 125/81 98/68   Pulse: 96  88  Resp: 18    Temp: 98.4 F (36.9 C)    TempSrc: Oral    SpO2: 100%  94%    Wt Readings from Last 3 Encounters:  05/09/17 63 kg (139 lb)  02/23/17 59 kg (130 lb)  01/26/17 59 kg (130  lb)    General:  Appears calm and comfortable Eyes:  PERRL, EOMI, normal lids, iris ENT:  grossly normal hearing, lips & tongue; no intra oral swelling, submandibular swelling on R with erythema Neck:  no LAD, masses or thyromegaly Cardiovascular:  RRR, no m/r/g. No LE edema.  Respiratory:  CTA bilaterally, no w/r/r. Normal respiratory effort. Abdomen:  soft, ntnd Skin:  no rash or induration seen on limited exam Musculoskeletal:  grossly normal tone BUE/BLE Psychiatric:  grossly normal mood and affect, speech fluent and appropriate Neurologic:  CN 2-12 grossly intact, moves all extremities in coordinated fashion.          Labs on Admission:  Basic Metabolic Panel: Recent Labs  Lab 05/31/17 2024 05/31/17 2045  NA 138 139  K 4.1 4.0  CL 107 106  CO2 20*  --   GLUCOSE 106* 108*  BUN 12 14  CREATININE 0.60 0.60  CALCIUM 9.0  --    Liver Function Tests: No results for input(s): AST, ALT, ALKPHOS, BILITOT, PROT, ALBUMIN in the last 168 hours. No results for input(s): LIPASE, AMYLASE in the last 168 hours. No results for input(s): AMMONIA in the last 168 hours. CBC: Recent Labs  Lab 05/31/17 2024 05/31/17 2045  WBC 17.2*  --   HGB 14.1 14.3  HCT 40.8 42.0  MCV 92.1  --  PLT 341  --    Cardiac Enzymes: No results for input(s): CKTOTAL, CKMB, CKMBINDEX, TROPONINI in the last 168 hours.  BNP (last 3 results) No results for input(s): BNP in the last 8760 hours.  ProBNP (last 3 results) No results for input(s): PROBNP in the last 8760 hours.   Creatinine clearance cannot be calculated (Unknown ideal weight.)  CBG: No results for input(s): GLUCAP in the last 168 hours.  Radiological Exams on Admission: Ct Soft Tissue Neck W Contrast  Result Date: 05/31/2017 CLINICAL DATA:  New onset right lower dental pain beginning yesterday. Neck pain, infection suspected. EXAM: CT NECK WITH CONTRAST TECHNIQUE: Multidetector CT imaging of the neck was performed using the  standard protocol following the bolus administration of intravenous contrast. CONTRAST:  88mL ISOVUE-300 IOPAMIDOL (ISOVUE-300) INJECTION 61% COMPARISON:  None. FINDINGS: Pharynx and larynx: Mild prominence of the palatine tonsils is noted bilaterally. No focal mucosal or submucosal lesions are present. Vocal cords are midline and symmetric. Trachea is unremarkable. Salivary glands: The submandibular and parotid glands are normal bilaterally. Thyroid: Normal Lymph nodes: No significant adenopathy is present. Level 2 nodes are within normal limits bilaterally. Vascular: No significant vascular lesions are present. Limited intracranial: Within normal limits. Visualized orbits: Unremarkable. Mastoids and visualized paranasal sinuses: Clear Skeleton: Vertebral body heights alignment are normal. No focal lytic or blastic lesions are present. Upper chest: The lung apices are clear. No focal nodule or mass lesion is present. Other: A prominent dental caries is present in the first right maxillary molar (tooth 30). Periapical lucency is associated. Smaller dental caries are present in the second right mandibular molar and first left mandibular molar. A small dental caries is present within the first right maxillary molar. There is prominent periapical lucency about the roots of this tooth. Right-sided inflammatory changes appear to be centered at the mandible, likely associated with the first molar. Inflammatory changes extend in the submandibular space. There is thickening the platysma and stranding into the subcutaneous space. IMPRESSION: 1. Right-sided dental caries and periapical abscess associated with the first maxillary and first mandibular molars on the right. 2. Right-sided soft tissue swelling appears to be centered about the right first mandibular molar and periapical abscess. No discrete soft tissue abscess is evident. 3. Other smaller dental caries are noted. Electronically Signed   By: San Morelle  M.D.   On: 05/31/2017 22:00    EKG: no new  Assessment/Plan Active Problems:   Ludwig's angina   Ludwig's angina Continue Cleocin Schedule steroids Toradol x1 IV fluids Clear liquid diet Dr. Redmond Baseman with ENT will assist if patient decompensates Cont puls eox  Tobacco abuse Nicotine patch  Code Status: FC  DVT Prophylaxis: heparin Family Communication: BF at bedside Disposition Plan: Pending Improvement  Status: med surg inpt  Elwin Mocha, MD Family Medicine [facility] [email or web address] Password [email or web address]

## 2017-06-01 NOTE — Progress Notes (Signed)
PROGRESS NOTE   Shannon Wolfe  LKT:[id number]    DOB: [birth year]    DOA: 05/31/2017  PCP: Patient, No Pcp Per   I have briefly reviewed patients previous medical records in [facility].  Brief Narrative:  27 year old female patient with PMH of abnormal uterine bleeding, dental caries, recently had a crown come off of a right-sided molar, went to see at dental surgeon but was unable to afford care, presented to ED due to subacute onset of right-sided lower face/neck swelling and gum pain without symptoms of airway compromise.  Admitted for right sided face/neck cellulitis due to dental caries.  ENT aware and to be called in the event of airway compromise.   Assessment & Plan:   Active Problems:   Ludwig's angina   1. 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  Started empirically on IV clindamycin and dexamethasone.  Clinically improving.  Continue current management.  Discuss with dental surgeon in a.m. regarding any inpatient intervention i.e. dental extraction versus outpatient follow-up.  Depending on progress, possible discharge in a.m. on oral antibiotics if no dental intervention planned. 2. Right-sided dental caries and periapical abscess: Discuss with dental surgeon 2/25.   DVT prophylaxis: Heparin Code Status: Full Family Communication: Boyfriend sleeping in room.  Did not arouse. Disposition: DC home pending clinical improvement.   Consultants:  ENT aware  Procedures:  None  Antimicrobials:  IV clindamycin   Subjective: "I feel 80% better".  Able to speak clearly.  Pain and swelling right side of lower jaw/neck has improved.  Pain along the right lower gumline improved.  No fever or chills.  No difficulty breathing, no noisy breathing and no difficulty swallowing.  ROS: As above.  Objective:  Vitals:   05/31/17 1820 05/31/17 2307 05/31/17 2308 06/01/17 0225  BP: 125/81 98/68  107/62  Pulse: 96  88 87  Resp: 18    18  Temp: 98.4 F (36.9 C)   98.4 F (36.9 C)  TempSrc: Oral   Oral  SpO2: 100%  94% 99%  Weight:    64.4 kg (141 lb 15.6 oz)  Height:    5' (1.524 m)    Examination:  General exam: Pleasant young female, moderately built and nourished, lying comfortably propped up in bed. HEENT: Moderate diffuse swelling right anterior submandibular region with mild tenderness, no crepitus or fluctuation.  No increased warmth.  Caries/cavitated second right lower molar tooth. Respiratory system: Clear to auscultation. Respiratory effort normal.  No stridor.  No wheezing or rhonchi.  No drooling. Cardiovascular system: S1 & S2 heard, RRR. No JVD, murmurs, rubs, gallops or clicks. No pedal edema. Gastrointestinal system: Abdomen is nondistended, soft and nontender. No organomegaly or masses felt. Normal bowel sounds heard. Central nervous system: Alert and oriented. No focal neurological deficits. Extremities: Symmetric 5 x 5 power. Skin: No rashes, lesions or ulcers Psychiatry: Judgement and insight appear normal. Mood & affect appropriate.     Data Reviewed: I have personally reviewed following labs and imaging studies  CBC: Recent Labs  Lab 05/31/17 2024 05/31/17 2045 06/01/17 0520  WBC 17.2*  --  15.2*  HGB 14.1 14.3 13.5  HCT 40.8 42.0 40.2  MCV 92.1  --  92.4  PLT 341  --  342   Basic Metabolic Panel: Recent Labs  Lab 05/31/17 2024 05/31/17 2045 06/01/17 0520  NA 138 139 135  K 4.1 4.0 3.8  CL 107 106 105  CO2 20*  --  20*  GLUCOSE 106* 108* 96  BUN 12 14 12   CREATININE 0.60 0.60 0.60  CALCIUM 9.0  --  8.6*   No results found for this or any previous visit (from the past 240 hour(s)).       Radiology Studies: Ct Soft Tissue Neck W Contrast  Result Date: 05/31/2017 CLINICAL DATA:  New onset right lower dental pain beginning yesterday. Neck pain, infection suspected. EXAM: CT NECK WITH CONTRAST TECHNIQUE: Multidetector CT imaging of the neck was performed using the  standard protocol following the bolus administration of intravenous contrast. CONTRAST:  49mL ISOVUE-300 IOPAMIDOL (ISOVUE-300) INJECTION 61% COMPARISON:  None. FINDINGS: Pharynx and larynx: Mild prominence of the palatine tonsils is noted bilaterally. No focal mucosal or submucosal lesions are present. Vocal cords are midline and symmetric. Trachea is unremarkable. Salivary glands: The submandibular and parotid glands are normal bilaterally. Thyroid: Normal Lymph nodes: No significant adenopathy is present. Level 2 nodes are within normal limits bilaterally. Vascular: No significant vascular lesions are present. Limited intracranial: Within normal limits. Visualized orbits: Unremarkable. Mastoids and visualized paranasal sinuses: Clear Skeleton: Vertebral body heights alignment are normal. No focal lytic or blastic lesions are present. Upper chest: The lung apices are clear. No focal nodule or mass lesion is present. Other: A prominent dental caries is present in the first right maxillary molar (tooth 30). Periapical lucency is associated. Smaller dental caries are present in the second right mandibular molar and first left mandibular molar. A small dental caries is present within the first right maxillary molar. There is prominent periapical lucency about the roots of this tooth. Right-sided inflammatory changes appear to be centered at the mandible, likely associated with the first molar. Inflammatory changes extend in the submandibular space. There is thickening the platysma and stranding into the subcutaneous space. IMPRESSION: 1. Right-sided dental caries and periapical abscess associated with the first maxillary and first mandibular molars on the right. 2. Right-sided soft tissue swelling appears to be centered about the right first mandibular molar and periapical abscess. No discrete soft tissue abscess is evident. 3. Other smaller dental caries are noted. Electronically Signed   By: San Morelle  M.D.   On: 05/31/2017 22:00        Scheduled Meds: . dexamethasone  8 mg Intravenous Q6H  . heparin  5,000 Units Subcutaneous Q8H  . nicotine  14 mg Transdermal Once  . nicotine  14 mg Transdermal Daily   Continuous Infusions: . [START ON 06/02/2017] clindamycin (CLEOCIN) IV       LOS: 0 days     Vernell Leep, MD, FACP, [facility]. [facility] Pager [phone]  If 7PM-7AM, please contact night-coverage [email or web address] Password [email or web address] 06/01/2017, 2:37 PM

## 2017-06-02 DIAGNOSIS — K029 Dental caries, unspecified: Secondary | ICD-10-CM

## 2017-06-02 DIAGNOSIS — K122 Cellulitis and abscess of mouth: Principal | ICD-10-CM

## 2017-06-02 LAB — CBC
HCT: 41.6 % (ref 36.0–46.0)
Hemoglobin: 14.1 g/dL (ref 12.0–15.0)
MCH: 31.1 pg (ref 26.0–34.0)
MCHC: 33.9 g/dL (ref 30.0–36.0)
MCV: 91.8 fL (ref 78.0–100.0)
PLATELETS: 351 10*3/uL (ref 150–400)
RBC: 4.53 MIL/uL (ref 3.87–5.11)
RDW: 13 % (ref 11.5–15.5)
WBC: 20.3 10*3/uL — AB (ref 4.0–10.5)

## 2017-06-02 MED ORDER — CLINDAMYCIN HCL 300 MG PO CAPS: 300.0000 mg | ORAL_CAPSULE | Freq: Four times a day (QID) | ORAL | 0 refills | Status: AC

## 2017-06-02 MED ORDER — IBUPROFEN 200 MG PO TABS: 600.0000 mg | ORAL_TABLET | Freq: Four times a day (QID) | ORAL | Status: DC | PRN

## 2017-06-02 NOTE — Discharge Instructions (Signed)
Please get your medications reviewed and adjusted by your Primary MD. ° °Please request your Primary MD to go over all Hospital Tests and Procedure/Radiological results at the follow up, please get all Hospital records sent to your Prim MD by signing hospital release before you go home. ° °If you had Pneumonia of Lung problems at the Hospital: °Please get a 2 view Chest X ray done in 6-8 weeks after hospital discharge or sooner if instructed by your Primary MD. ° °If you have Congestive Heart Failure: °Please call your Cardiologist or Primary MD anytime you have any of the following symptoms:  °1) 3 pound weight gain in 24 hours or 5 pounds in 1 week  °2) shortness of breath, with or without a dry hacking cough  °3) swelling in the hands, feet or stomach  °4) if you have to sleep on extra pillows at night in order to breathe ° °Follow cardiac low salt diet and 1.5 lit/day fluid restriction. ° °If you have diabetes °Accuchecks 4 times/day, Once in AM empty stomach and then before each meal. °Log in all results and show them to your primary doctor at your next visit. °If any glucose reading is under 80 or above 300 call your primary MD immediately. ° °If you have Seizure/Convulsions/Epilepsy: °Please do not drive, operate heavy machinery, participate in activities at heights or participate in high speed sports until you have seen by Primary MD or a Neurologist and advised to do so again. ° °If you had Gastrointestinal Bleeding: °Please ask your Primary MD to check a complete blood count within one week of discharge or at your next visit. Your endoscopic/colonoscopic biopsies that are pending at the time of discharge, will also need to followed by your Primary MD. ° °Get Medicines reviewed and adjusted. °Please take all your medications with you for your next visit with your Primary MD ° °Please request your Primary MD to go over all hospital tests and procedure/radiological results at the follow up, please ask your  Primary MD to get all Hospital records sent to his/her office. ° °If you experience worsening of your admission symptoms, develop shortness of breath, life threatening emergency, suicidal or homicidal thoughts you must seek medical attention immediately by calling 911 or calling your MD immediately  if symptoms less severe. ° °You must read complete instructions/literature along with all the possible adverse reactions/side effects for all the Medicines you take and that have been prescribed to you. Take any new Medicines after you have completely understood and accpet all the possible adverse reactions/side effects.  ° °Do not drive or operate heavy machinery when taking Pain medications.  ° °Do not take more than prescribed Pain, Sleep and Anxiety Medications ° °Special Instructions: If you have smoked or chewed Tobacco  in the last 2 yrs please stop smoking, stop any regular Alcohol  and or any Recreational drug use. ° °Wear Seat belts while driving. ° °Please note °You were cared for by a hospitalist during your hospital stay. If you have any questions about your discharge medications or the care you received while you were in the hospital after you are discharged, you can call the unit and asked to speak with the hospitalist on call if the hospitalist that took care of you is not available. Once you are discharged, your primary care physician will handle any further medical issues. Please note that NO REFILLS for any discharge medications will be authorized once you are discharged, as it is imperative that you   return to your primary care physician (or establish a relationship with a primary care physician if you do not have one) for your aftercare needs so that they can reassess your need for medications and monitor your lab values.  You can reach the hospitalist office at phone [phone] or fax [phone]   If you do not have a primary care physician, you can call [phone] for a physician  referral.   Dental Caries, Adult Dental caries are spots of decay (cavities) in the outer layer of your tooth (enamel). The natural bacteria in your mouth produce acid when breaking down sugary foods and drinks. When you eat or drink a lot of sugary foods and liquids, a lot of acid is produced. The acid destroys the protective enamel of your tooth, leading to tooth decay. It is important to treat your tooth decay as soon as possible. Untreated dental caries can spread decay and lead to painful infection. Brushing regularly with fluoride toothpaste (oral hygiene) and getting regular dental checkups can help prevent dental caries. What are the causes? Dental caries are caused by the acid that is produced when bacteria break down sugary or acidic foods and drinks. What increases the risk? This condition is more likely to develop in young adults. This condition is also more likely to develop in people who:  Drink a lot of sugary liquids, including alcoholic drinks, such as champagne.  Eat a lot of sweets and carbohydrates.  Drink water that is not treated with fluoride.  Have poor oral hygiene.  Have deep grooves in their teeth.  Take certain medicines that decrease saliva.  What are the signs or symptoms? Symptoms of dental caries include:  White, brown, or black spots on the teeth.  Pain.  Swollen or bleeding gums.  How is this diagnosed? Your dentist may suspect dental caries from your signs and symptoms. The dentist will also do an oral exam. This may include X-rays to confirm the diagnosis. Sometimes lights, a thin probe, and dyes are used to find dental caries (using electrical conductivity or using laser reflection). How is this treated? Treatment for dental caries usually involves a procedure to remove the decay and restore the tooth with a filling or a sealant. Follow these instructions at home:  Practice good oral hygiene. This keeps your mouth and gums healthy. Use  fluoride toothpaste to brush your teeth twice a day, and floss once a day.  If your dentist prescribed an antibiotic medicine to treat an infection, take it as told by your dentist. Do not stop taking the antibiotic even if your condition improves.  Keep all follow-up visits as told by your dentist. This is important. This includes all cleanings. How is this prevented? To prevent dental caries:  Brush your teeth every morning and night with fluoride toothpaste.  Get regular dental cleanings.  If you are at risk of dental caries, wash your mouth with prescription mouthwash (chlorhexidine) and apply topical fluoride to your teeth.  Drink fluoridated water regularly.  Drink water instead of sugary drinks.  Eat healthy meals and snacks.  Contact a health care provider if:  You have symptoms of tooth decay. This information is not intended to replace advice given to you by your health care provider. Make sure you discuss any questions you have with your health care provider. Document Released: 12/15/2001 Document Revised: 11/22/2015 Document Reviewed: 10/06/2015 Elsevier Interactive Patient Education  2018 [employer].

## 2017-06-02 NOTE — Progress Notes (Signed)
1158 Patient discharged to home. Verbalizes understanding of all discharge instructions including discharge medications and follow up MD visits.

## 2017-06-02 NOTE — Discharge Summary (Addendum)
Physician Discharge Summary  SCOTTLYNN LINDELL FWY:[id number] DOB: 27-Jun-1990  PCP: Patient, No Pcp Per  Admit date: 05/31/2017 Discharge date: 06/02/2017  Recommendations for Outpatient Follow-up:  1. [place], PCP in 1 week with repeat labs (CBC & BMP). 2. Dental surgeon of choice, ASAP.  Home Health: None Equipment/Devices: None  Discharge Condition: Improved and stable CODE STATUS: Full Diet recommendation: Regular diet  Discharge Diagnoses:  Active Problems:   Ludwig's angina   Brief Summary: 27 year old female patient with PMH of abnormal uterine bleeding, dental caries, recently had a crown come off of a right-sided molar, went to see at dental surgeon but was unable to afford care, presented to ED due to subacute onset of right-sided lower face/neck swelling and gum pain without symptoms of airway compromise.  Admitted for right sided face/neck cellulitis due to dental caries.    Assessment & Plan:   1. 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  Started empirically on IV clindamycin and dexamethasone.  Clinically significantly improved.  I attempted to discuss with [facility] dental surgeon on call but unable to get in touch thus far and left VM message.  Patient however states that she feels much better and wishes to follow-up with outpatient dental surgeons and apparently has a couple of their numbers to call.  She states that even if her Medicaid does not come through, she is willing to pay out of pocket to have her dental work done ASAP.  She was not even willing to stay until discharge papers were completed and was threatening to leave AMA but finally agreed to wait.  I discussed with infectious disease MD on call and patient will be discharged on oral clindamycin to complete total 14 days course.  Source of her infection i.e. dental caries will have to be definitively addressed to avoid recurrence of  same issue which could be dangerous.  This was discussed in detail with patient and she verbalizes understanding. 2. Right-sided dental caries and periapical abscess:  Please see discussion above. 3. Leukocytosis: Clinically her infection is improving.  The leukocytosis is most likely due to steroids.  Follow CBCs in a few days as outpatient.   Consultants:  None  Procedures:  None   Discharge Instructions  Discharge Instructions    Activity as tolerated - No restrictions   Complete by:  As directed    Call MD for:  difficulty breathing, headache or visual disturbances   Complete by:  As directed    Call MD for:  extreme fatigue   Complete by:  As directed    Call MD for:  persistant dizziness or light-headedness   Complete by:  As directed    Call MD for:  redness, tenderness, or signs of infection (pain, swelling, redness, odor or green/yellow discharge around incision site)   Complete by:  As directed    Call MD for:  severe uncontrolled pain   Complete by:  As directed    Call MD for:  temperature >100.4   Complete by:  As directed    Diet general   Complete by:  As directed        Medication List    TAKE these medications   clindamycin 300 MG capsule Commonly known as:  CLEOCIN Take 1 capsule (300 mg total) by mouth 4 (four) times daily.   ibuprofen 200 MG tablet Commonly known as:  ADVIL,MOTRIN Take 3 tablets (600 mg total) by mouth every  6 (six) hours as needed for fever, headache, mild pain or moderate pain. What changed:    how much to take  reasons to take this      Follow-up Information    Dental Surgeon,of choice. Schedule an appointment as soon as possible for a visit.   Why:  To be seen as soon as possible.       [place]. Schedule an appointment as soon as possible for a visit in 1 week(s).   Why:  To be seen with repeat labs (CBC & BMP). Contact information: [place]  [phone]         No Known Allergies    Procedures/Studies: Ct Soft Tissue Neck W Contrast  Result Date: 05/31/2017 CLINICAL DATA:  New onset right lower dental pain beginning yesterday. Neck pain, infection suspected. EXAM: CT NECK WITH CONTRAST TECHNIQUE: Multidetector CT imaging of the neck was performed using the standard protocol following the bolus administration of intravenous contrast. CONTRAST:  16mL ISOVUE-300 IOPAMIDOL (ISOVUE-300) INJECTION 61% COMPARISON:  None. FINDINGS: Pharynx and larynx: Mild prominence of the palatine tonsils is noted bilaterally. No focal mucosal or submucosal lesions are present. Vocal cords are midline and symmetric. Trachea is unremarkable. Salivary glands: The submandibular and parotid glands are normal bilaterally. Thyroid: Normal Lymph nodes: No significant adenopathy is present. Level 2 nodes are within normal limits bilaterally. Vascular: No significant vascular lesions are present. Limited intracranial: Within normal limits. Visualized orbits: Unremarkable. Mastoids and visualized paranasal sinuses: Clear Skeleton: Vertebral body heights alignment are normal. No focal lytic or blastic lesions are present. Upper chest: The lung apices are clear. No focal nodule or mass lesion is present. Other: A prominent dental caries is present in the first right maxillary molar (tooth 30). Periapical lucency is associated. Smaller dental caries are present in the second right mandibular molar and first left mandibular molar. A small dental caries is present within the first right maxillary molar. There is prominent periapical lucency about the roots of this tooth. Right-sided inflammatory changes appear to be centered at the mandible, likely associated with the first molar. Inflammatory changes extend in the submandibular space. There is thickening the platysma and stranding into the subcutaneous space. IMPRESSION: 1. Right-sided dental caries and periapical  abscess associated with the first maxillary and first mandibular molars on the right. 2. Right-sided soft tissue swelling appears to be centered about the right first mandibular molar and periapical abscess. No discrete soft tissue abscess is evident. 3. Other smaller dental caries are noted. Electronically Signed   By: San Morelle M.D.   On: 05/31/2017 22:00      Subjective: States that she feels much better.  Right-sided jaw area pain and swelling have almost resolved.  Able to tolerate regular consistency diet.  No difficulty swallowing or breathing.  No fever or chills.  Patient was interviewed and examined with RN in room.  Discharge Exam:  Vitals:   06/01/17 0225 06/01/17 1443 06/01/17 2036 06/02/17 0455  BP: 107/62 114/64 116/67 106/64  Pulse: 87 89 (!) 101 96  Resp: 18  18 18   Temp: 98.4 F (36.9 C) 98.6 F (37 C) 97.6 F (36.4 C) 98.8 F (37.1 C)  TempSrc: Oral Oral Oral Oral  SpO2: 99% 98% 96% 98%  Weight: 64.4 kg (141 lb 15.6 oz)     Height: 5' (1.524 m)       General exam: Pleasant young female, moderately built and nourished, lying  comfortably propped up in bed. HEENT:  Minimal swelling right anterior submandibular region along the jawline with no tenderness, no crepitus or fluctuation.  No increased warmth.  This is significantly improved compared to yesterday. Caries/cavitated second right lower molar tooth. Respiratory system: Clear to auscultation. Respiratory effort normal.  No stridor.  No wheezing or rhonchi.  No drooling. Cardiovascular system: S1 & S2 heard, RRR. No JVD, murmurs, rubs, gallops or clicks. No pedal edema. Gastrointestinal system: Abdomen is nondistended, soft and nontender. No organomegaly or masses felt. Normal bowel sounds heard. Central nervous system: Alert and oriented. No focal neurological deficits. Extremities: Symmetric 5 x 5 power. Skin: No rashes, lesions or ulcers Psychiatry: Judgement and insight appear normal. Mood & affect  appropriate.       The results of significant diagnostics from this hospitalization (including imaging, microbiology, ancillary and laboratory) are listed below for reference.      Labs: CBC: Recent Labs  Lab 05/31/17 2024 05/31/17 2045 06/01/17 0520 06/02/17 0657  WBC 17.2*  --  15.2* 20.3*  HGB 14.1 14.3 13.5 14.1  HCT 40.8 42.0 40.2 41.6  MCV 92.1  --  92.4 91.8  PLT 341  --  322 825   Basic Metabolic Panel: Recent Labs  Lab 05/31/17 2024 05/31/17 2045 06/01/17 0520  NA 138 139 135  K 4.1 4.0 3.8  CL 107 106 105  CO2 20*  --  20*  GLUCOSE 106* 108* 96  BUN 12 14 12   CREATININE 0.60 0.60 0.60  CALCIUM 9.0  --  8.6*       Time coordinating discharge: Less than 30 minutes  SIGNED:  Vernell Leep, MD, FACP, [facility]. [facility] Pager [phone]  If 7PM-7AM, please contact night-coverage [email or web address] Password [facility] 06/02/2017, 10:54 AM

## 2017-06-02 NOTE — Care Management Note (Signed)
Case Management Note  Patient Details  Name: Shannon Wolfe MRN: [id number] Date of Birth: 1990-04-23  Subjective/Objective:                    Action/Plan:  [facility] letter , information on [facility] ( patient states she was there 2 weeks ago) , and information on two dental clinics given and explained.   Patient voiced understanding to all of the above. Expected Discharge Date:  06/02/17               Expected Discharge Plan:  Home/Self Care  In-House Referral:     Discharge planning Services  CM Consult, Medication Assistance, [facility], [facility]  Post Acute Care Choice:  NA Choice offered to:  Patient  DME Arranged:  N/A DME Agency:  NA  HH Arranged:  NA HH Agency:  NA  Status of Service:  Completed, signed off  If discussed at [place] of Stay Meetings, dates discussed:    Additional Comments:  Marilu Favre, RN 06/02/2017, 11:16 AM

## 2017-09-25 ENCOUNTER — Emergency Department (HOSPITAL_BASED_OUTPATIENT_CLINIC_OR_DEPARTMENT_OTHER)
Admission: EM | Admit: 2017-09-25 | Discharge: 2017-09-25 | Disposition: A | Discharge status: Home / Self Care | Payer: Medicaid Other | Attending: Emergency Medicine | Admitting: Emergency Medicine

## 2017-09-25 ENCOUNTER — Encounter (HOSPITAL_BASED_OUTPATIENT_CLINIC_OR_DEPARTMENT_OTHER): Payer: Self-pay

## 2017-09-25 ENCOUNTER — Other Ambulatory Visit: Payer: Self-pay

## 2017-09-25 DIAGNOSIS — Y929 Unspecified place or not applicable: Secondary | ICD-10-CM | POA: Diagnosis not present

## 2017-09-25 DIAGNOSIS — L03116 Cellulitis of left lower limb: Secondary | ICD-10-CM | POA: Diagnosis not present

## 2017-09-25 DIAGNOSIS — Y998 Other external cause status: Secondary | ICD-10-CM | POA: Diagnosis not present

## 2017-09-25 DIAGNOSIS — F17210 Nicotine dependence, cigarettes, uncomplicated: Secondary | ICD-10-CM | POA: Insufficient documentation

## 2017-09-25 DIAGNOSIS — L03115 Cellulitis of right lower limb: Secondary | ICD-10-CM | POA: Diagnosis not present

## 2017-09-25 DIAGNOSIS — W57XXXA Bitten or stung by nonvenomous insect and other nonvenomous arthropods, initial encounter: Secondary | ICD-10-CM | POA: Insufficient documentation

## 2017-09-25 DIAGNOSIS — Y939 Activity, unspecified: Secondary | ICD-10-CM | POA: Insufficient documentation

## 2017-09-25 DIAGNOSIS — S70362A Insect bite (nonvenomous), left thigh, initial encounter: Secondary | ICD-10-CM | POA: Diagnosis present

## 2017-09-25 DIAGNOSIS — L03119 Cellulitis of unspecified part of limb: Secondary | ICD-10-CM

## 2017-09-25 MED ORDER — CEPHALEXIN 500 MG PO CAPS: 500.0000 mg | ORAL_CAPSULE | Freq: Four times a day (QID) | ORAL | 0 refills | Status: AC

## 2017-09-25 MED ORDER — HYDROCORTISONE 2.5 % EX LOTN: TOPICAL_LOTION | Freq: Two times a day (BID) | CUTANEOUS | 0 refills | Status: AC

## 2017-09-25 MED ORDER — DIPHENHYDRAMINE HCL 25 MG PO TABS: 25.0000 mg | ORAL_TABLET | Freq: Four times a day (QID) | ORAL | 0 refills | Status: AC

## 2017-09-25 NOTE — ED Triage Notes (Signed)
Pt c/o ?insect bites to both legs yesterday-NAD-steady gait

## 2017-09-25 NOTE — ED Provider Notes (Signed)
[facility] EMERGENCY DEPARTMENT Provider Note   CSN: [id number] Arrival date & time: 09/25/17  1646     History   Chief Complaint Chief Complaint  Patient presents with  . Insect Bite    HPI Shannon Wolfe is a 27 y.o. female who presents with a 1 day history of areas of pain, redness, and swelling after being bit by insects last night.  She reports it began as small last evening, but have been increasing in size.  She has tried Benadryl cream without relief.  She denies any fevers or other symptoms.  She denies any swelling of her lips, tongue, throat, difficulty breathing.  She has no known allergies.  HPI  Past Medical History:  Diagnosis Date  . Abnormal uterine bleeding     Patient Active Problem List   Diagnosis Date Noted  . 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 Establishing care with new doctor, encounter for 03/28/2014    Past Surgical History:  Procedure Laterality Date  . NO PAST SURGERIES       OB History    Gravida  1   Para  1   Term  1   Preterm      AB      Living  1     SAB      TAB      Ectopic      Multiple      Live Births               Home Medications    Prior to Admission medications   Medication Sig Start Date End Date Taking? Authorizing Provider  cephALEXin (KEFLEX) 500 MG capsule Take 1 capsule (500 mg total) by mouth 4 (four) times daily. 09/25/17   Kenzlei Runions, Bea Graff, PA-C  clindamycin (CLEOCIN) 300 MG capsule Take 1 capsule (300 mg total) by mouth 4 (four) times daily. 06/02/17   Hongalgi, Lenis Dickinson, MD  diphenhydrAMINE (BENADRYL) 25 MG tablet Take 1 tablet (25 mg total) by mouth every 6 (six) hours. 09/25/17   Kiyan Burmester, Bea Graff, PA-C  hydrocortisone 2.5 % lotion Apply topically 2 (two) times daily. 09/25/17   Emberlynn Riggan, Bea Graff, PA-C  ibuprofen (ADVIL,MOTRIN) 200 MG tablet Take 3 tablets (600 mg total) by mouth every 6 (six) hours as needed for fever, headache, mild pain or moderate pain.  06/02/17   Hongalgi, Lenis Dickinson, MD    Family History Family History  Problem Relation Age of Onset  . Diabetes Father     Social History Social History   Tobacco Use  . Smoking status: Current Every Day Smoker    Packs/day: 1.00    Types: Cigarettes  . Smokeless tobacco: Current User  Substance Use Topics  . Alcohol use: Not Currently  . Drug use: Yes    Types: Marijuana     Allergies   Patient has no known allergies.   Review of Systems Review of Systems  Constitutional: Negative for fever.  HENT: Negative for facial swelling.   Respiratory: Negative for shortness of breath.   Skin: Positive for rash.     Physical Exam Updated Vital Signs BP (!) 115/93 (BP Location: Left Arm)   Pulse (!) 110   Temp 98.5 F (36.9 C) (Oral)   Resp 16   Ht 5' (1.524 m)   Wt 64 kg (141 lb)   LMP 09/19/2017   SpO2 98%   BMI 27.54 kg/m   Physical Exam  Constitutional: She appears well-developed  and well-nourished. No distress.  HENT:  Head: Normocephalic and atraumatic.  Mouth/Throat: Oropharynx is clear and moist. No oropharyngeal exudate.  No swelling of the lips, tongue, throat  Eyes: Pupils are equal, round, and reactive to light. Conjunctivae are normal. Right eye exhibits no discharge. Left eye exhibits no discharge. No scleral icterus.  Neck: Normal range of motion. Neck supple. No thyromegaly present.  Cardiovascular: Normal rate, regular rhythm, normal heart sounds and intact distal pulses. Exam reveals no gallop and no friction rub.  No murmur heard. Pulmonary/Chest: Effort normal and breath sounds normal. No stridor. No respiratory distress. She has no wheezes. She has no rales.  Musculoskeletal: She exhibits no edema.  Lymphadenopathy:    She has no cervical adenopathy.  Neurological: She is alert. Coordination normal.  Skin: Skin is warm and dry. No rash noted. She is not diaphoretic. No pallor.  4 areas of erythema and warmth on bilateral legs, all tender; L  upper thigh is most significantly warm and tender, see photo  Psychiatric: She has a normal mood and affect.  Nursing note and vitals reviewed.      ED Treatments / Results  Labs (all labs ordered are listed, but only abnormal results are displayed) Labs Reviewed - No data to display  EKG None  Radiology No results found.  Procedures Procedures (including critical care time)  Medications Ordered in ED Medications - No data to display   Initial Impression / Assessment and Plan / ED Course  I have reviewed the triage vital signs and the nursing notes.  Pertinent labs & imaging results that were available during my care of the patient were reviewed by me and considered in my medical decision making (see chart for details).     Patient presentation consistent with insect bites with possible early cellulitis.  Considering increase in pain, warmth after tick bites, will cover with antibiotics.  Afebrile. No tachycardia, hypotension or other symptoms suggestive of severe infection. Area has been demarcated and pt advised to follow up for wound check in 2-3 days if not improving, sooner for worsening systemic symptoms, new lymphangitis, or significant spread of erythema past line of demarcation. Will discharge with Keflex. Return precautions discussed.  Patient understands and agrees with plan.  Patient vitals stable throughout ED course and discharged in satisfactory condition.   Final Clinical Impressions(s) / ED Diagnoses   Final diagnoses:  Cellulitis of lower extremity, unspecified laterality  Insect bite, unspecified site, initial encounter    ED Discharge Orders        Ordered    cephALEXin (KEFLEX) 500 MG capsule  4 times daily     09/25/17 1705    diphenhydrAMINE (BENADRYL) 25 MG tablet  Every 6 hours     09/25/17 1705    hydrocortisone 2.5 % lotion  2 times daily     09/25/17 [id number] PA-C 09/25/17 1708    Jola Schmidt, MD 09/26/17  0011

## 2017-09-25 NOTE — Discharge Instructions (Signed)
Medications: Keflex, Benadryl, hydrocortisone cream  Treatment: Take Keflex 4 times daily until completed.  Take Benadryl every 6 hours as needed for itching.  Apply hydrocortisone cream twice daily.  Follow-up: Please return to the emergency department in 2 days if your symptoms are not improving or sooner if you are experiencing significant spread of redness past the line drawn in the ED.  If you are having improvement, make sure to finish all the Keflex.

## 2017-12-13 ENCOUNTER — Other Ambulatory Visit: Payer: Self-pay

## 2017-12-13 ENCOUNTER — Emergency Department (HOSPITAL_COMMUNITY)
Admission: EM | Admit: 2017-12-13 | Discharge: 2017-12-13 | Disposition: A | Discharge status: Home / Self Care | Payer: Medicaid Other | Attending: Emergency Medicine | Admitting: Emergency Medicine

## 2017-12-13 ENCOUNTER — Encounter (HOSPITAL_COMMUNITY): Payer: Self-pay | Admitting: Emergency Medicine

## 2017-12-13 DIAGNOSIS — R222 Localized swelling, mass and lump, trunk: Secondary | ICD-10-CM | POA: Diagnosis present

## 2017-12-13 DIAGNOSIS — D171 Benign lipomatous neoplasm of skin and subcutaneous tissue of trunk: Secondary | ICD-10-CM

## 2017-12-13 DIAGNOSIS — Z79899 Other long term (current) drug therapy: Secondary | ICD-10-CM | POA: Insufficient documentation

## 2017-12-13 DIAGNOSIS — F17210 Nicotine dependence, cigarettes, uncomplicated: Secondary | ICD-10-CM | POA: Insufficient documentation

## 2017-12-13 DIAGNOSIS — D179 Benign lipomatous neoplasm, unspecified: Secondary | ICD-10-CM | POA: Insufficient documentation

## 2017-12-13 MED ORDER — IBUPROFEN 400 MG PO TABS
600.0000 mg | ORAL_TABLET | Freq: Once | ORAL | Status: AC
  Administered 2017-12-13: 600 mg via ORAL
  Filled 2017-12-13: qty 1

## 2017-12-13 MED ORDER — IBUPROFEN 600 MG PO TABS: 600.0000 mg | ORAL_TABLET | Freq: Four times a day (QID) | ORAL | 0 refills | Status: AC | PRN

## 2017-12-13 NOTE — ED Triage Notes (Signed)
C/o lump on right lower chest wall, upper abd, and L side x 6-12 months.  Pt states she has been seen here and by PCP for same and was referred to plastic surgeon but hasn't followed up.

## 2017-12-13 NOTE — Discharge Instructions (Addendum)
Follow up with your doctor for referral to plastics for consideration of removal of lipomas.

## 2017-12-13 NOTE — ED Provider Notes (Signed)
[facility] DE[facility] Provider Note   CSN: [id number] Arrival date & time: 12/13/17  0346     History   Chief Complaint Chief Complaint  Patient presents with  . Mass    HPI Shannon Wolfe is a 27 y.o. female.  Patient presents with complaint of discomfort associated with longstanding chest wall masses. No fever, redness, injury to the areas. No nausea, vomiting.   The history is provided by the patient. No language interpreter was used.    Past Medical History:  Diagnosis Date  . Abnormal uterine bleeding     Patient Active Problem List   Diagnosis Date Noted  . 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 Establishing care with new doctor, encounter for 03/28/2014    Past Surgical History:  Procedure Laterality Date  . NO PAST SURGERIES       OB History    Gravida  1   Para  1   Term  1   Preterm      AB      Living  1     SAB      TAB      Ectopic      Multiple      Live Births               Home Medications    Prior to Admission medications   Medication Sig Start Date End Date Taking? Authorizing Provider  cephALEXin (KEFLEX) 500 MG capsule Take 1 capsule (500 mg total) by mouth 4 (four) times daily. 09/25/17   Law, Bea Graff, PA-C  clindamycin (CLEOCIN) 300 MG capsule Take 1 capsule (300 mg total) by mouth 4 (four) times daily. 06/02/17   Hongalgi, Lenis Dickinson, MD  diphenhydrAMINE (BENADRYL) 25 MG tablet Take 1 tablet (25 mg total) by mouth every 6 (six) hours. 09/25/17   Law, Bea Graff, PA-C  hydrocortisone 2.5 % lotion Apply topically 2 (two) times daily. 09/25/17   Law, Bea Graff, PA-C  ibuprofen (ADVIL,MOTRIN) 200 MG tablet Take 3 tablets (600 mg total) by mouth every 6 (six) hours as needed for fever, headache, mild pain or moderate pain. 06/02/17   Hongalgi, Lenis Dickinson, MD    Family History Family History  Problem Relation Age of Onset  . Diabetes Father     Social  History Social History   Tobacco Use  . Smoking status: Current Every Day Smoker    Packs/day: 1.00    Types: Cigarettes  . Smokeless tobacco: Current User  Substance Use Topics  . Alcohol use: Not Currently  . Drug use: Yes    Types: Marijuana     Allergies   Patient has no known allergies.   Review of Systems Review of Systems  Constitutional: Negative for fever.  Respiratory: Negative for cough and shortness of breath.   Cardiovascular:       C/o chest wall pain  Gastrointestinal: Negative for nausea.  Musculoskeletal: Negative for myalgias.     Physical Exam Updated Vital Signs BP 110/75 (BP Location: Right Arm)   Pulse 97   Temp 98 F (36.7 C) (Oral)   Resp 16   LMP 11/29/2017   SpO2 97%   Physical Exam  Constitutional: She is oriented to person, place, and time. She appears well-developed and well-nourished.  Neck: Normal range of motion.  Pulmonary/Chest: Effort normal.  Neurological: She is alert and oriented to person, place, and time.  Skin: Skin is warm and dry.  3 raised, firm, mobile masses to chest wall c/w lipoma. No abscess, acute infection or concerning change.      ED Treatments / Results  Labs (all labs ordered are listed, but only abnormal results are displayed) Labs Reviewed - No data to display  EKG None  Radiology No results found.  Procedures Procedures (including critical care time)  Medications Ordered in ED Medications - No data to display   Initial Impression / Assessment and Plan / ED Course  I have reviewed the triage vital signs and the nursing notes.  Pertinent labs & imaging results that were available during my care of the patient were reviewed by me and considered in my medical decision making (see chart for details).     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   Final Clinical Impressions(s) / ED Diagnoses   Final diagnoses:  None   1. Lipomas  ED Discharge  Orders    None       Charlann Lange, Hershal Coria 12/16/17 [id number]    Palumbo, April, MD 12/18/17 0011

## 2018-06-04 IMAGING — CT CT CHEST W/ CM
2 of 6 series · 13 of 36 positions shown, 16 images · IV contrast (Omni 300)
Comparison: Rib films from earlier today

CLINICAL DATA: Lump on right side in region of tenth, eleventh, and
twelfth ribs.

EXAM:
CT CHEST WITH CONTRAST
TECHNIQUE: Multidetector CT imaging of the chest was performed during
intravenous contrast administration.
CONTRAST:  75mL MHKUQS-9OO IOPAMIDOL (MHKUQS-9OO) INJECTION 61%

[Series 3: chest with 2mm st · axial · 0.59mm/px · z∈[+939,+1141]mm · 12 of 121 slices shown, 15 images]
[im 10/121  mediastinal]
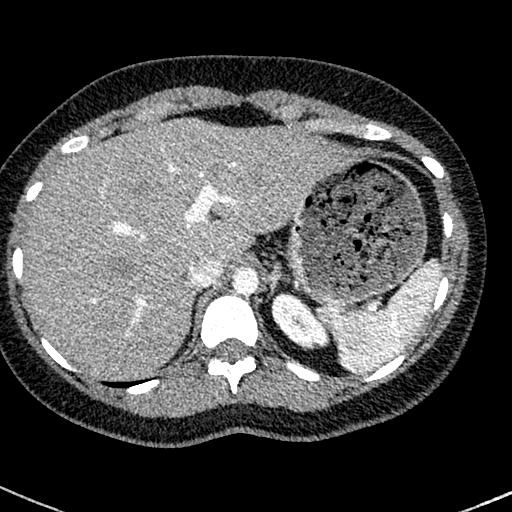
[im 10/121  lung]
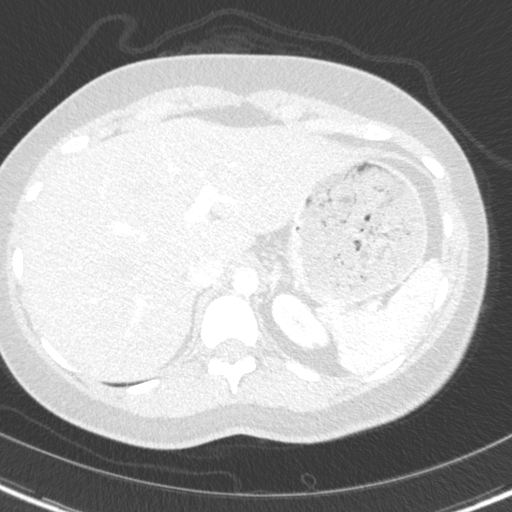
[im 19/121  lung]
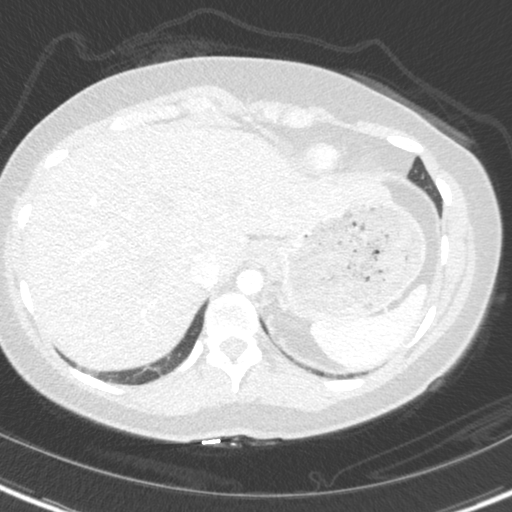
[im 28/121  lung]
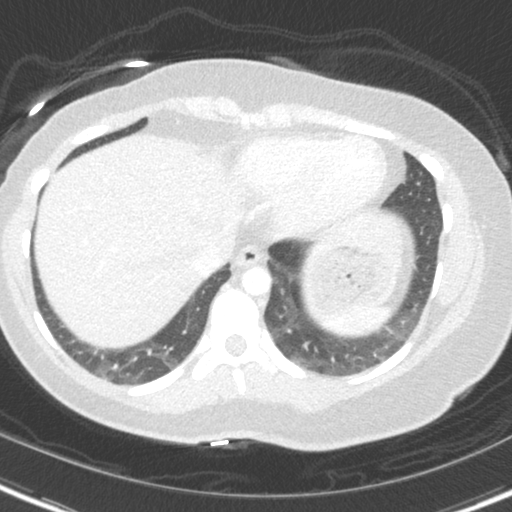
[im 37/121  lung]
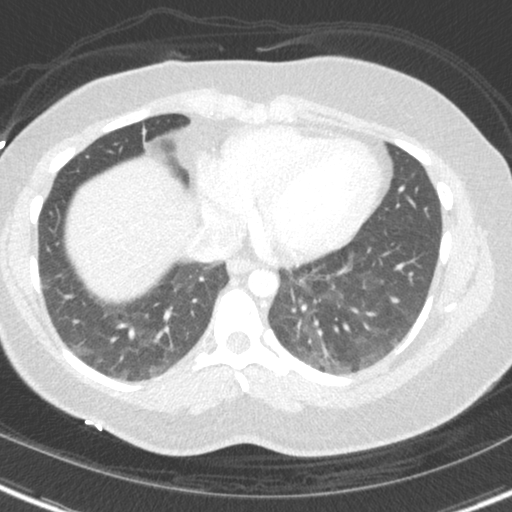
[im 47/121  mediastinal]
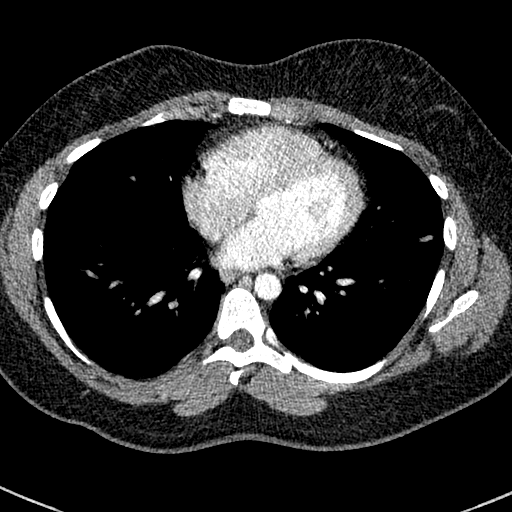
[im 47/121  lung]
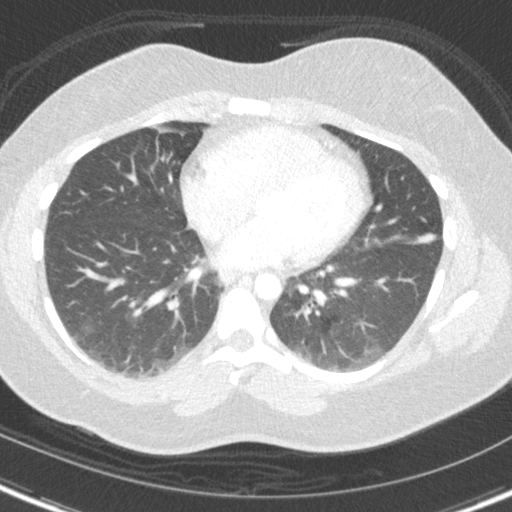
[im 56/121  lung]
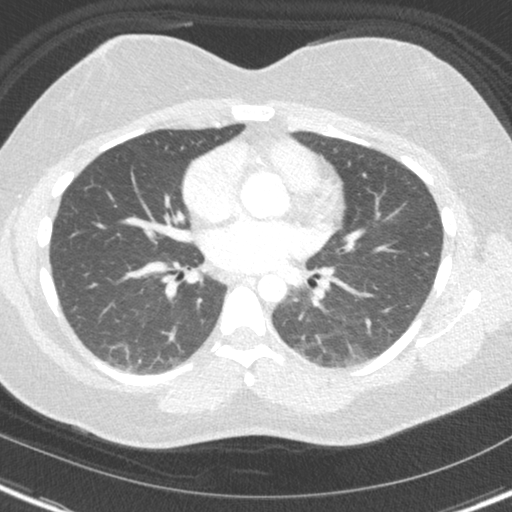
[im 65/121  lung]
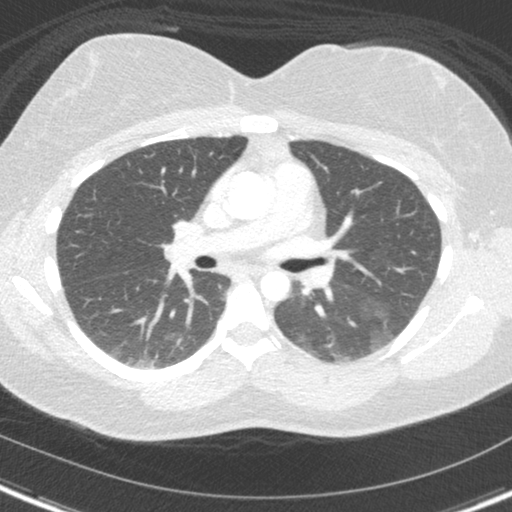
[im 74/121  lung]
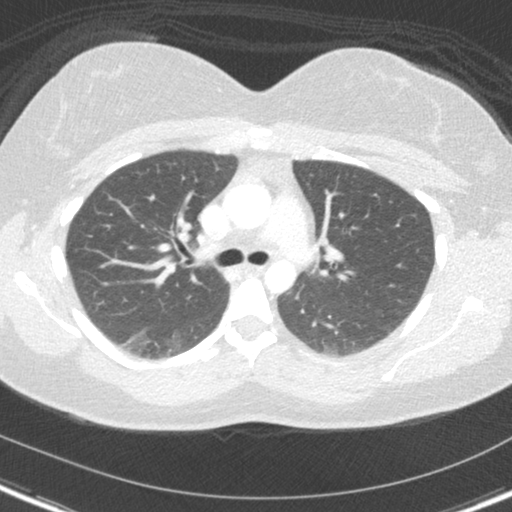
[im 84/121  mediastinal]
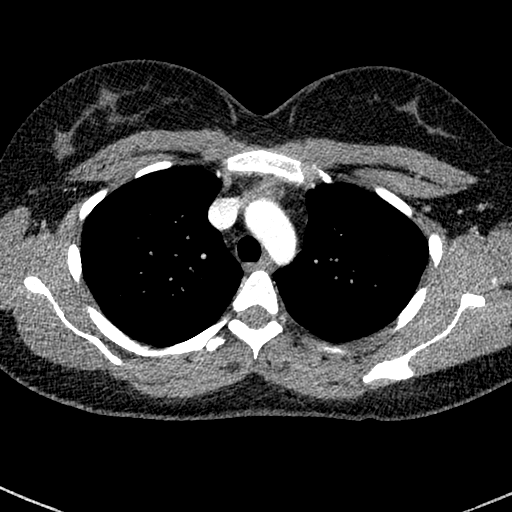
[im 84/121  lung]
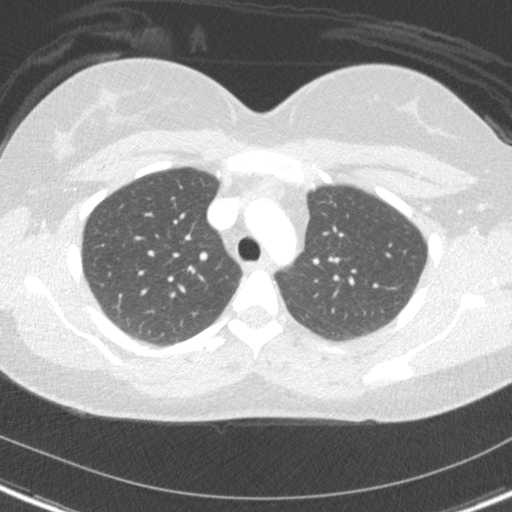
[im 93/121  lung]
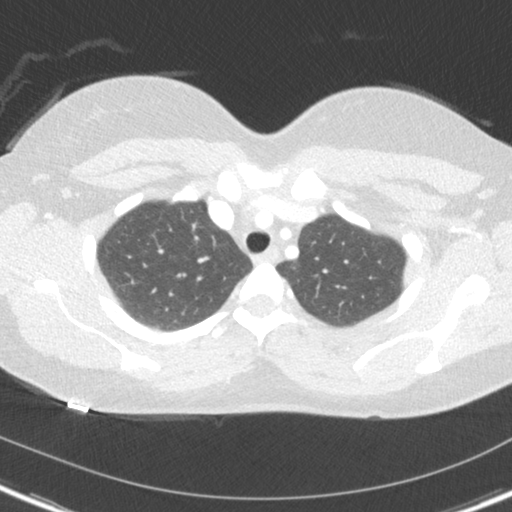
[im 102/121  lung]
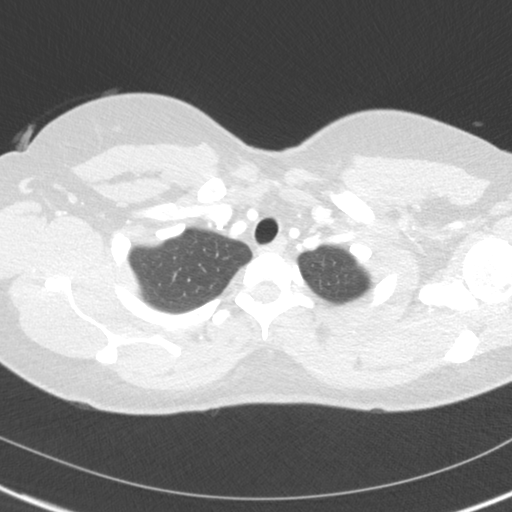
[im 111/121  lung]
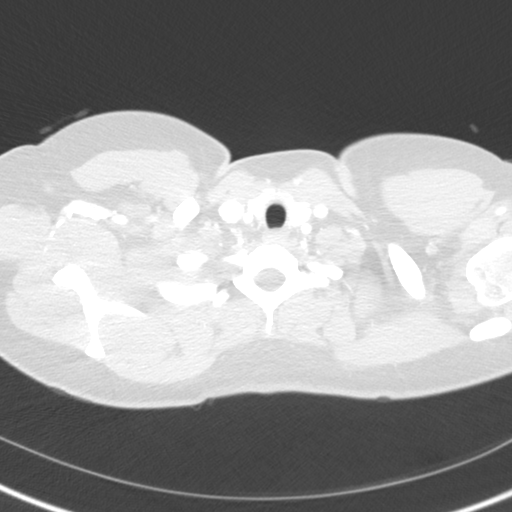

[Series 5: chest with 3mm st cor · coronal · 0.50mm/px · 1 of 100 slices shown]
[im 50/100  lung]
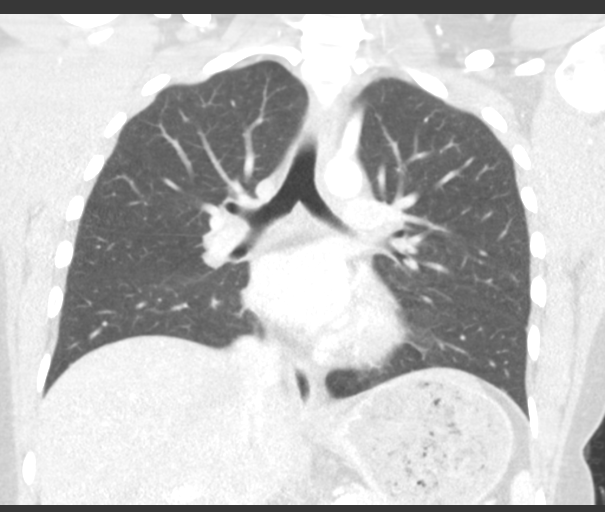

[13 of 36 positions shown; findings below may reference images not displayed]

FINDINGS: Cardiovascular: No significant vascular findings. Normal heart size.
No pericardial effusion.

Mediastinum/Nodes: No enlarged mediastinal, hilar, or axillary lymph
nodes. Thyroid gland, trachea, and esophagus demonstrate no
significant findings.

Lungs/Pleura: Subsegmental atelectasis in the lingula, right middle
lobe, and dependent portions of the lung bases. No nodules, masses,
or suspicious infiltrate. The central airways are normal. No
pneumothorax.

Upper Abdomen: No acute abnormality.

Musculoskeletal: The site of the patient's painful lump is marked on
series 8, image 113 with a BB. No underlying soft tissue
abnormalities identified. Superficial soft tissues are otherwise
normal. The underlying bones are normal. No bony abnormalities.
IMPRESSION: 1. No cause for the patient's symptoms identified.
2. Scattered subsegmental atelectasis as above. No acute
abnormalities.

## 2018-07-03 ENCOUNTER — Other Ambulatory Visit: Payer: Self-pay

## 2018-07-03 ENCOUNTER — Emergency Department (HOSPITAL_COMMUNITY)
Admission: EM | Admit: 2018-07-03 | Discharge: 2018-07-04 | Disposition: A | Discharge status: Home / Self Care | Payer: Medicaid Other | Attending: Emergency Medicine | Admitting: Emergency Medicine

## 2018-07-03 ENCOUNTER — Emergency Department (HOSPITAL_COMMUNITY): Payer: Medicaid Other

## 2018-07-03 ENCOUNTER — Encounter (HOSPITAL_COMMUNITY): Payer: Self-pay

## 2018-07-03 DIAGNOSIS — R079 Chest pain, unspecified: Secondary | ICD-10-CM | POA: Insufficient documentation

## 2018-07-03 DIAGNOSIS — Z79899 Other long term (current) drug therapy: Secondary | ICD-10-CM | POA: Insufficient documentation

## 2018-07-03 DIAGNOSIS — F17210 Nicotine dependence, cigarettes, uncomplicated: Secondary | ICD-10-CM | POA: Diagnosis not present

## 2018-07-03 LAB — CBC
HCT: 41.9 % (ref 36.0–46.0)
HEMOGLOBIN: 13.8 g/dL (ref 12.0–15.0)
MCH: 30.7 pg (ref 26.0–34.0)
MCHC: 32.9 g/dL (ref 30.0–36.0)
MCV: 93.1 fL (ref 80.0–100.0)
NRBC: 0 % (ref 0.0–0.2)
PLATELETS: 310 10*3/uL (ref 150–400)
RBC: 4.5 MIL/uL (ref 3.87–5.11)
RDW: 13.2 % (ref 11.5–15.5)
WBC: 14.2 10*3/uL — ABNORMAL HIGH (ref 4.0–10.5)

## 2018-07-03 LAB — BASIC METABOLIC PANEL
ANION GAP: 14 (ref 5–15)
BUN: 9 mg/dL (ref 6–20)
CALCIUM: 9.6 mg/dL (ref 8.9–10.3)
CHLORIDE: 103 mmol/L (ref 98–111)
CO2: 23 mmol/L (ref 22–32)
Creatinine, Ser: 0.73 mg/dL (ref 0.44–1.00)
GFR calc non Af Amer: >60 mL/min (ref >60)
Glucose, Bld: 116 mg/dL — ABNORMAL HIGH (ref 70–99)
POTASSIUM: 3.8 mmol/L (ref 3.5–5.1)
Sodium: 140 mmol/L (ref 135–145)

## 2018-07-03 LAB — I-STAT BETA HCG BLOOD, ED (MC, WL, AP ONLY): I-stat hCG, quantitative: <5 m[IU]/mL (ref <5)

## 2018-07-03 LAB — TROPONIN I: Troponin I: <0.03 ng/mL (ref <0.03)

## 2018-07-03 MED ORDER — SODIUM CHLORIDE 0.9% FLUSH: 3.0000 mL | Freq: Once | INTRAVENOUS | Status: DC

## 2018-07-03 NOTE — ED Triage Notes (Signed)
Pt here for centralized chest pain for the last day.  Pt states intermittent shortness of breath for the last day as well.  No recent travel.  Talking in complete sentences in triage. Took multiple phone calls in triage. A&Ox4

## 2018-07-04 MED ORDER — ALBUTEROL SULFATE HFA 108 (90 BASE) MCG/ACT IN AERS
2.0000 | INHALATION_SPRAY | Freq: Once | RESPIRATORY_TRACT | Status: AC
  Administered 2018-07-04: 2 via RESPIRATORY_TRACT
  Filled 2018-07-04: qty 6.7

## 2018-07-04 NOTE — ED Provider Notes (Signed)
[facility] Provider Note   CSN: [id number] Arrival date & time: 07/03/18  2025    History   Chief Complaint Chief Complaint  Patient presents with  . Chest Pain    HPI Shannon Wolfe is a 28 y.o. female.     Patient with no significant medical history, smoker, presents with central chest "soreness", tightness for the past 1-2 days. She has intermittent SOB. No cough, congestion, fever, nausea, vomiting. She denies aggravating or alleviating symptoms. Symptoms are intermittent. She has not had to change activity and has not missed any work. She reports she was told by her employer that she had to be checked before returning to work. No sick contacts, recent travel. She reports having similar symptoms in the past.   The history is provided by the patient. No language interpreter was used.  Chest Pain  Associated symptoms: shortness of breath   Associated symptoms: no abdominal pain, no fever, no nausea and no vomiting     Past Medical History:  Diagnosis Date  . Abnormal uterine bleeding     Patient Active Problem List   Diagnosis Date Noted  . 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 Establishing care with new doctor, encounter for 03/28/2014    Past Surgical History:  Procedure Laterality Date  . NO PAST SURGERIES       OB History    Gravida  1   Para  1   Term  1   Preterm      AB      Living  1     SAB      TAB      Ectopic      Multiple      Live Births               Home Medications    Prior to Admission medications   Medication Sig Start Date End Date Taking? Authorizing Provider  cephALEXin (KEFLEX) 500 MG capsule Take 1 capsule (500 mg total) by mouth 4 (four) times daily. 09/25/17   Law, Bea Graff, PA-C  clindamycin (CLEOCIN) 300 MG capsule Take 1 capsule (300 mg total) by mouth 4 (four) times daily. 06/02/17   Hongalgi, Lenis Dickinson, MD  diphenhydrAMINE (BENADRYL) 25  MG tablet Take 1 tablet (25 mg total) by mouth every 6 (six) hours. 09/25/17   Law, Bea Graff, PA-C  hydrocortisone 2.5 % lotion Apply topically 2 (two) times daily. 09/25/17   Law, Bea Graff, PA-C  ibuprofen (ADVIL,MOTRIN) 600 MG tablet Take 1 tablet (600 mg total) by mouth every 6 (six) hours as needed. 12/13/17   Charlann Lange, PA-C    Family History Family History  Problem Relation Age of Onset  . Diabetes Father     Social History Social History   Tobacco Use  . Smoking status: Current Every Day Smoker    Packs/day: 1.00    Types: Cigarettes  . Smokeless tobacco: Current User  Substance Use Topics  . Alcohol use: Not Currently  . Drug use: Yes    Types: Marijuana     Allergies   Patient has no known allergies.   Review of Systems Review of Systems  Constitutional: Negative for chills and fever.  HENT: Negative.  Negative for congestion and sore throat.   Respiratory: Positive for chest tightness and shortness of breath.   Cardiovascular: Negative for chest pain.  Gastrointestinal: Negative.  Negative for abdominal pain, nausea and vomiting.  Musculoskeletal: Negative.  Skin: Negative.   Neurological: Negative.      Physical Exam Updated Vital Signs BP 115/87 (BP Location: Right Arm)   Pulse 82   Temp 98.6 F (37 C) (Oral)   Resp 18   LMP 06/19/2018   SpO2 98%   Physical Exam Constitutional:      Appearance: She is well-developed.  HENT:     Head: Normocephalic.  Neck:     Musculoskeletal: Normal range of motion and neck supple.  Cardiovascular:     Rate and Rhythm: Normal rate and regular rhythm.  Pulmonary:     Effort: Pulmonary effort is normal.     Breath sounds: Normal breath sounds. No wheezing, rhonchi or rales.     Comments: Chest wall mildly tender bilateral sternal borders. Abdominal:     General: Bowel sounds are normal.     Palpations: Abdomen is soft.     Tenderness: There is no abdominal tenderness. There is no guarding or  rebound.  Musculoskeletal: Normal range of motion.  Skin:    General: Skin is warm and dry.     Findings: No rash.  Neurological:     Mental Status: She is alert and oriented to person, place, and time.      ED Treatments / Results  Labs (all labs ordered are listed, but only abnormal results are displayed) Labs Reviewed  BASIC METABOLIC PANEL - Abnormal; Notable for the following components:      Result Value   Glucose, Bld 116 (*)    All other components within normal limits  CBC - Abnormal; Notable for the following components:   WBC 14.2 (*)    All other components within normal limits  TROPONIN I  I-STAT BETA HCG BLOOD, ED ([facility], [facility], [facility] ONLY)    EKG EKG Interpretation  Date/Time:  Friday July 03 2018 20:37:25 EDT Ventricular Rate:  108 PR Interval:  124 QRS Duration: 90 QT Interval:  346 QTC Calculation: 463 R Axis:   61 Text Interpretation:  Sinus tachycardia Confirmed by Dory Horn) on 07/03/2018 11:39:58 PM   Radiology Dg Chest 2 View  Result Date: 07/03/2018 CLINICAL DATA:  Initial evaluation for acute chest pain. EXAM: CHEST - 2 VIEW COMPARISON:  Prior CT from 02/23/2017. FINDINGS: The cardiac and mediastinal silhouettes are stable in size and contour, and remain within normal limits. The lungs are normally inflated. No airspace consolidation, pleural effusion, or pulmonary edema is identified. There is no pneumothorax. No acute osseous abnormality identified. IMPRESSION: No radiographic evidence for active cardiopulmonary disease. Electronically Signed   By: Jeannine Boga M.D.   On: 07/03/2018 21:50    Procedures Procedures (including critical care time)  Medications Ordered in ED Medications  sodium chloride flush (NS) 0.9 % injection 3 mL (has no administration in time range)  albuterol (PROVENTIL HFA;VENTOLIN HFA) 108 (90 Base) MCG/ACT inhaler 2 puff (2 puffs Inhalation Given 07/04/18 0124)     Initial Impression / Assessment and  Plan / ED Course  I have reviewed the triage vital signs and the nursing notes.  Pertinent labs & imaging results that were available during my care of the patient were reviewed by me and considered in my medical decision making (see chart for details).        Patient to ED with anterior central chest tightness and SOB x 1-2 days. No cough, fever. No sick contacts, recent travel. Low degree of suspicion for COVID.   She has tried inhalers without relief. She feels at baseline  now. No risk factors for PE. No infection seen on clear CXR. She has a mild leukocytosis that seems to be an isolated finding and nondiagnostic.    Inhaler provided. She feels improved after 2 puffs with the inhaler. She can be discharged home. Note provided for her to be allowed to return to work.   Final Clinical Impressions(s) / ED Diagnoses   Final diagnoses:  None   1. Nonspecific chest pain  ED Discharge Orders    None       Dennie Bible [birth year] 7616    Delora Fuel, MD 07/37/10 [phone]

## 2018-07-04 NOTE — Discharge Instructions (Addendum)
Your tests and chest x-ray do not show any infection or cause for your symptoms. Use the inhaler as this provided relief for you every 4 hours as needed for any recurrent symptoms. Stop smoking. Please establish with a primary care provider (references provided) for routine medical concerns as needed.

## 2018-07-04 NOTE — ED Notes (Signed)
The pt feels like she has bruises on her chest  No visible bruises   She was too hot while in the kitchen  Earlier  When she left the kitchen her symptoms left also      She has a hx of the same   Alert no distress

## 2023-06-03 ENCOUNTER — Emergency Department (HOSPITAL_COMMUNITY)
Admission: EM | Admit: 2023-06-03 | Discharge: 2023-06-04 | Disposition: A | Discharge status: Home / Self Care | Payer: Medicaid Other | Attending: Emergency Medicine | Admitting: Emergency Medicine

## 2023-06-03 ENCOUNTER — Encounter (HOSPITAL_COMMUNITY): Payer: Self-pay | Admitting: Emergency Medicine

## 2023-06-03 ENCOUNTER — Emergency Department (HOSPITAL_COMMUNITY): Payer: Medicaid Other

## 2023-06-03 ENCOUNTER — Other Ambulatory Visit: Payer: Self-pay

## 2023-06-03 DIAGNOSIS — D72829 Elevated white blood cell count, unspecified: Secondary | ICD-10-CM | POA: Insufficient documentation

## 2023-06-03 DIAGNOSIS — G5601 Carpal tunnel syndrome, right upper limb: Secondary | ICD-10-CM | POA: Diagnosis not present

## 2023-06-03 DIAGNOSIS — R200 Anesthesia of skin: Secondary | ICD-10-CM | POA: Diagnosis present

## 2023-06-03 LAB — I-STAT CHEM 8, ED
BUN: 11 mg/dL (ref 6–20)
Calcium, Ion: 1.22 mmol/L (ref 1.15–1.40)
Chloride: 102 mmol/L (ref 98–111)
Creatinine, Ser: 0.8 mg/dL (ref 0.44–1.00)
Glucose, Bld: 82 mg/dL (ref 70–99)
HCT: 44 % (ref 36.0–46.0)
Hemoglobin: 15 g/dL (ref 12.0–15.0)
Potassium: 3.7 mmol/L (ref 3.5–5.1)
Sodium: 139 mmol/L (ref 135–145)
TCO2: 29 mmol/L (ref 22–32)

## 2023-06-03 LAB — CBC
HCT: 42.5 % (ref 36.0–46.0)
Hemoglobin: 14.5 g/dL (ref 12.0–15.0)
MCH: 31.6 pg (ref 26.0–34.0)
MCHC: 34.1 g/dL (ref 30.0–36.0)
MCV: 92.6 fL (ref 80.0–100.0)
Platelets: 363 10*3/uL (ref 150–400)
RBC: 4.59 MIL/uL (ref 3.87–5.11)
RDW: 13.1 % (ref 11.5–15.5)
WBC: 12.1 10*3/uL — ABNORMAL HIGH (ref 4.0–10.5)
nRBC: 0 % (ref 0.0–0.2)

## 2023-06-03 LAB — URINALYSIS, ROUTINE W REFLEX MICROSCOPIC
Bilirubin Urine: NEGATIVE
Glucose, UA: NEGATIVE mg/dL
Hgb urine dipstick: NEGATIVE
Ketones, ur: 5 mg/dL — AB
Nitrite: NEGATIVE
Protein, ur: NEGATIVE mg/dL
Specific Gravity, Urine: 1.025 (ref 1.005–1.030)
WBC, UA: >50 WBC/hpf (ref 0–5)
pH: 5 (ref 5.0–8.0)

## 2023-06-03 LAB — RAPID URINE DRUG SCREEN, HOSP PERFORMED
Amphetamines: NOT DETECTED
Barbiturates: NOT DETECTED
Benzodiazepines: NOT DETECTED
Cocaine: NOT DETECTED
Opiates: NOT DETECTED
Tetrahydrocannabinol: POSITIVE — AB

## 2023-06-03 LAB — DIFFERENTIAL
Abs Immature Granulocytes: 0.04 10*3/uL (ref 0.00–0.07)
Basophils Absolute: 0.1 10*3/uL (ref 0.0–0.1)
Basophils Relative: 1 %
Eosinophils Absolute: 0.1 10*3/uL (ref 0.0–0.5)
Eosinophils Relative: 1 %
Immature Granulocytes: 0 %
Lymphocytes Relative: 38 %
Lymphs Abs: 4.6 10*3/uL — ABNORMAL HIGH (ref 0.7–4.0)
Monocytes Absolute: 0.7 10*3/uL (ref 0.1–1.0)
Monocytes Relative: 6 %
Neutro Abs: 6.6 10*3/uL (ref 1.7–7.7)
Neutrophils Relative %: 54 %

## 2023-06-03 LAB — PROTIME-INR
INR: 1 (ref 0.8–1.2)
Prothrombin Time: 13.3 s (ref 11.4–15.2)

## 2023-06-03 LAB — COMPREHENSIVE METABOLIC PANEL
ALT: 22 U/L (ref 0–44)
AST: 25 U/L (ref 15–41)
Albumin: 4 g/dL (ref 3.5–5.0)
Alkaline Phosphatase: 67 U/L (ref 38–126)
Anion gap: 11 (ref 5–15)
BUN: 8 mg/dL (ref 6–20)
CO2: 28 mmol/L (ref 22–32)
Calcium: 9.9 mg/dL (ref 8.9–10.3)
Chloride: 101 mmol/L (ref 98–111)
Creatinine, Ser: 0.8 mg/dL (ref 0.44–1.00)
GFR, Estimated: >60 mL/min (ref >60)
Glucose, Bld: 89 mg/dL (ref 70–99)
Potassium: 3.7 mmol/L (ref 3.5–5.1)
Sodium: 140 mmol/L (ref 135–145)
Total Bilirubin: 0.9 mg/dL (ref 0.0–1.2)
Total Protein: 7.4 g/dL (ref 6.5–8.1)

## 2023-06-03 LAB — APTT: aPTT: 27 s (ref 24–36)

## 2023-06-03 LAB — HCG, SERUM, QUALITATIVE: Preg, Serum: NEGATIVE

## 2023-06-03 LAB — ETHANOL: Alcohol, Ethyl (B): <10 mg/dL (ref <10)

## 2023-06-03 NOTE — ED Provider Triage Note (Signed)
 Emergency Medicine Provider Triage Evaluation Note  Shannon Wolfe , a 33 y.o. female  was evaluated in triage.  Pt complains of right upper extremity numbness, weakness also pain.  Patient states 2 days ago woke up with pain to her right upper extremity.  Reports pain is also present in her left arm.  Denies any weakness or numbness in her right lower extremity.  Denies any slurred speech or facial droop.  Also concern for 2 masses bilaterally to her chest wall that she reports has been seen in the past.  Review of Systems  Positive:  Negative:   Physical Exam  BP (!) 127/95 (BP Location: Right Arm)   Pulse (!) 106   Temp 98.1 F (36.7 C) (Oral)   Resp 16   SpO2 98%  Gen:   Awake, no distress   Resp:  Normal effort  MSK:   Moves extremities without difficulty  Other:  3 out of 5 strength right upper extremity.  No other focal neurodeficits  Medical Decision Making  Medically screening exam initiated at 6:02 PM.  Appropriate orders placed.  Etter A Sparks was informed that the remainder of the evaluation will be completed by another provider, this initial triage assessment does not replace that evaluation, and the importance of remaining in the ED until their evaluation is complete.     Al Decant, PA-C 06/03/23 1805

## 2023-06-03 NOTE — ED Triage Notes (Signed)
 Pt report RUE numbness/tingling and weak grasp. Pt states symptoms have been going on x 3 days. Denies any known cardiac hx. No hx of stroke. Pt alert and oriented on exam. No drift noted. Speech clear. Pt also reports a mass on right side of chest/abd that has increased in size.

## 2023-06-04 NOTE — Progress Notes (Signed)
 Orthopedic Tech Progress Note Patient Details:  Shannon Wolfe [birth year] [id number]  Ortho Devices Type of Ortho Device: Velcro wrist splint Ortho Device/Splint Location: Right wrist Ortho Device/Splint Interventions: Application   Post Interventions Patient Tolerated: Well  [name] 06/04/2023, 8:35 AM

## 2023-06-04 NOTE — Discharge Instructions (Signed)
 Please follow-up with your primary care provider in regards recent ER visit.  Today your labs and imaging are reassuring you most likely have carpal tunnel from overuse.  Please take Tylenol or ibuprofen every 6 hours needed for pain, ice, use the splint and rest for the next few days.  If symptoms change or worsen please return to the ER.

## 2023-06-04 NOTE — ED Provider Notes (Addendum)
 [facility]PART[facility] AT [facility] Provider Note   CSN: [id number] Arrival date & time: 06/03/23  1717     History  Chief Complaint  Patient presents with   Extremity Pain    RUE    Shannon Wolfe is a 33 y.o. female with no pertinent past medical history presented with numbness to her right fingertips along with wrist pain for the past few days.  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  Patient denies any weakness or dropping objects.  Patient denies any head or neck pain or vision changes.  Patient is no history of strokes or blood clots.  Patient denies any trauma or fall on outstretched hands.  Patient has taken ibuprofen but states the pain is still present.  Patient does not use a wrist splint.    Home Medications Prior to Admission medications   Medication Sig Start Date End Date Taking? Authorizing Provider  cephALEXin (KEFLEX) 500 MG capsule Take 1 capsule (500 mg total) by mouth 4 (four) times daily. 09/25/17   Law, Waylan Boga, PA-C  clindamycin (CLEOCIN) 300 MG capsule Take 1 capsule (300 mg total) by mouth 4 (four) times daily. 06/02/17   Hongalgi, Maximino Greenland, MD  diphenhydrAMINE (BENADRYL) 25 MG tablet Take 1 tablet (25 mg total) by mouth every 6 (six) hours. 09/25/17   Law, Waylan Boga, PA-C  hydrocortisone 2.5 % lotion Apply topically 2 (two) times daily. 09/25/17   Law, Waylan Boga, PA-C  ibuprofen (ADVIL,MOTRIN) 600 MG tablet Take 1 tablet (600 mg total) by mouth every 6 (six) hours as needed. 12/13/17   Elpidio Anis, PA-C      Allergies    Patient has no known allergies.    Review of Systems   Review of Systems  Physical Exam Updated Vital Signs BP 131/86 (BP Location: Right Arm)   Pulse 83   Temp 98.3 F (36.8 C)   Resp 18   SpO2 98%  Physical Exam Vitals reviewed.  Constitutional:      General: She is not in acute distress. Cardiovascular:     Rate and Rhythm: Normal  rate.     Pulses: Normal pulses.  Musculoskeletal:     Comments: Right wrist: Positive Tinel's sign, generalized tenderness throughout the wrist without bony abnormalities, no scaphoid tenderness, 5 out of 5 bilateral grip strength, wrist extension/flexion Pain not out of proportion Soft compartments  Skin:    General: Skin is warm and dry.     Capillary Refill: Capillary refill takes less than 2 seconds.  Neurological:     Mental Status: She is alert.     Comments: Sensation intact distally  Psychiatric:        Mood and Affect: Mood normal.    ED Results / Procedures / Treatments   Labs (all labs ordered are listed, but only abnormal results are displayed) Labs Reviewed  CBC - Abnormal; Notable for the following components:      Result Value   WBC 12.1 (*)    All other components within normal limits  DIFFERENTIAL - Abnormal; Notable for the following components:   Lymphs Abs 4.6 (*)    All other components within normal limits  RAPID URINE DRUG SCREEN, HOSP PERFORMED - Abnormal; Notable for the following components:   Tetrahydrocannabinol POSITIVE (*)    All other components within normal limits  URINALYSIS, ROUTINE W REFLEX MICROSCOPIC - Abnormal; Notable  for the following components:   Color, Urine AMBER (*)    APPearance CLOUDY (*)    Ketones, ur 5 (*)    Leukocytes,Ua LARGE (*)    Bacteria, UA MANY (*)    All other components within normal limits  ETHANOL  PROTIME-INR  APTT  COMPREHENSIVE METABOLIC PANEL  HCG, SERUM, QUALITATIVE  I-STAT CHEM 8, ED    EKG None  Radiology CT HEAD WO CONTRAST Result Date: 06/03/2023 CLINICAL DATA:  Acute stroke suspected. EXAM: CT HEAD WITHOUT CONTRAST TECHNIQUE: Contiguous axial images were obtained from the base of the skull through the vertex without intravenous contrast. RADIATION DOSE REDUCTION: This exam was performed according to the departmental dose-optimization program which includes automated exposure control,  adjustment of the mA and/or kV according to patient size and/or use of iterative reconstruction technique. COMPARISON:  None Available. FINDINGS: Brain: No evidence of acute infarction, hemorrhage, hydrocephalus, extra-axial collection or mass lesion/mass effect. Vascular: No hyperdense vessel or unexpected calcification. Skull: Normal. Negative for fracture or focal lesion. Sinuses/Orbits: No acute finding. Other: None. IMPRESSION: No acute intracranial abnormality. Electronically Signed   By: Darliss Cheney M.D.   On: 06/03/2023 20:27    Procedures Procedures    Medications Ordered in ED Medications - No data to display  ED Course/ Medical Decision Making/ A&P                                 Medical Decision Making  Shannon Wolfe 33 y.o. presented today for right wrist pain. Working DDx that I considered at this time includes, but not limited to, contusion, strain/sprain, fracture, dislocation, neurovascular compromise, septic joint, ischemic limb, compartment syndrome.  R/o DDx:  contusion, strain/sprain, fracture, dislocation, neurovascular compromise, septic joint, ischemic limb, compartment syndrome: These are considered less likely due to history of present illness, physical exam, labs/imaging findings.  Review of prior external notes: 02/22/2018 initial consult  Unique Tests and My Independent Interpretation:  CBC: Mild leukocytosis 12.1 CMP: Unremarkable PT/INR: Unremarkable aPTT: Unremarkable Ethanol: Unremarkable hCG serum qualitative: Negative UA: Unremarkable UDS: THC positive I-STAT Chem-8: Unremarkable CT head without contrast: Unremarkable EKG: Sinus 95 bpm, no ST elevations or depressions, no signs of ischemia or right heart strain  Social Determinants of Health: none  Discussion with Independent Historian: None  Discussion of Management of Tests: None  Risk: Medium: prescription drug management  Risk Stratification Score: None  Plan: On exam  patient was no acute distress stable vitals.  Patient's labs and imaging from triage are reassuring.  Upon my evaluation of the patient patient has a positive Tinel sign and does do a lot of handy work including cleaning.  Patient states she does not think it is her brain and thinks that she hurt her wrist.  I agree with the patient as patient's lab and imaging from triage was reassuring and with her exam do feel this is MSK in nature not neurologic.  With a positive Tinel sign do think patient may have carpal tunnel causing her pain and will give wrist splint.  I offered imaging however patient declined at this time which is reasonable.  Patient's physical exam and imaging are reassuring.  I spoke to the patient about RICE therapy including Tylenol every 6 hours needed pain, ice 3-4 times daily for 15 minutes at a time, elevation of extremity, using a brace and to follow-up with their primary care provider.  Work note given.  Patient  was given return precautions. Patient stable for discharge at this time.  Patient verbalized understanding of plan.  This chart was dictated using voice recognition software.  Despite best efforts to proofread,  errors can occur which can change the documentation meaning.  Final Clinical Impression(s) / ED Diagnoses Final diagnoses:  Carpal tunnel syndrome of right wrist    Rx / DC Orders ED Discharge Orders     None         Remi Deter 06/04/23 1131    Jacalyn Lefevre, MD 06/04/23 1610

## 2023-06-04 NOTE — ED Notes (Signed)
 Pt walked out of the ED and state the wait Is too long and her son has to go to school.

## 2023-06-21 ENCOUNTER — Other Ambulatory Visit: Payer: Self-pay

## 2023-06-21 ENCOUNTER — Emergency Department (HOSPITAL_BASED_OUTPATIENT_CLINIC_OR_DEPARTMENT_OTHER)

## 2023-06-21 ENCOUNTER — Encounter (HOSPITAL_BASED_OUTPATIENT_CLINIC_OR_DEPARTMENT_OTHER): Payer: Self-pay | Admitting: Emergency Medicine

## 2023-06-21 ENCOUNTER — Emergency Department (HOSPITAL_BASED_OUTPATIENT_CLINIC_OR_DEPARTMENT_OTHER)
Admission: EM | Admit: 2023-06-21 | Discharge: 2023-06-21 | Disposition: A | Discharge status: Home / Self Care | Attending: Emergency Medicine | Admitting: Emergency Medicine

## 2023-06-21 DIAGNOSIS — Y92410 Unspecified street and highway as the place of occurrence of the external cause: Secondary | ICD-10-CM | POA: Diagnosis not present

## 2023-06-21 DIAGNOSIS — S060XAA Concussion with loss of consciousness status unknown, initial encounter: Secondary | ICD-10-CM | POA: Insufficient documentation

## 2023-06-21 DIAGNOSIS — S0990XA Unspecified injury of head, initial encounter: Secondary | ICD-10-CM | POA: Diagnosis present

## 2023-06-21 MED ORDER — KETOROLAC TROMETHAMINE 30 MG/ML IJ SOLN
30.0000 mg | Freq: Once | INTRAMUSCULAR | Status: AC
  Administered 2023-06-21: 30 mg via INTRAMUSCULAR
  Filled 2023-06-21: qty 1

## 2023-06-21 MED ORDER — ONDANSETRON HCL 4 MG PO TABS: 4.0000 mg | ORAL_TABLET | Freq: Four times a day (QID) | ORAL | 0 refills | Status: AC

## 2023-06-21 MED ORDER — METHOCARBAMOL 500 MG PO TABS: 500.0000 mg | ORAL_TABLET | Freq: Two times a day (BID) | ORAL | 0 refills | Status: DC

## 2023-06-21 MED ORDER — ACETAMINOPHEN 325 MG PO TABS
650.0000 mg | ORAL_TABLET | Freq: Once | ORAL | Status: AC
  Administered 2023-06-21: 650 mg via ORAL
  Filled 2023-06-21: qty 2

## 2023-06-21 MED ORDER — CYCLOBENZAPRINE HCL 5 MG PO TABS
5.0000 mg | ORAL_TABLET | Freq: Once | ORAL | Status: AC
  Administered 2023-06-21: 5 mg via ORAL
  Filled 2023-06-21: qty 1

## 2023-06-21 NOTE — Discharge Instructions (Signed)
 While you are in the emergency room, you had a CT scan done of your head and neck that was normal.  Your x-rays of your hand and your chest were negative.  You can take Motrin and Tylenol at home.  I have sent a prescription for medicine called Robaxin which can help out with some the discomfort that you are experiencing.  You can use this 2 times per day.  Do not use this if you are driving.  Like we discussed, you likely have a concussion.  This is essentially a bruise on your brain.  Often times in people have a concussion, they can feel headaches, nauseated, or feel sensitive to light.  It can be difficult to say what will make the symptoms worse, but that is your brains way of saying that you need to stop what you are doing and rest.  You can take Motrin, Tylenol and Zofran for this.  Most people symptoms will improve over days to weeks.  Return to the emergency room if you lose, has noticed, or develop persistent vomiting.

## 2023-06-21 NOTE — ED Notes (Signed)
 ED Provider at bedside.
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
## 2023-06-21 NOTE — ED Notes (Signed)
 Pt refused pregnancy test; states it's unneeded and currently on menstrual cycle

## 2024-03-13 ENCOUNTER — Encounter (HOSPITAL_BASED_OUTPATIENT_CLINIC_OR_DEPARTMENT_OTHER): Payer: Self-pay

## 2024-03-13 ENCOUNTER — Other Ambulatory Visit: Payer: Self-pay

## 2024-03-13 ENCOUNTER — Emergency Department (HOSPITAL_BASED_OUTPATIENT_CLINIC_OR_DEPARTMENT_OTHER)
Admission: EM | Admit: 2024-03-13 | Discharge: 2024-03-13 | Disposition: A | Discharge status: Home / Self Care | Attending: Emergency Medicine | Admitting: Emergency Medicine

## 2024-03-13 ENCOUNTER — Other Ambulatory Visit (HOSPITAL_BASED_OUTPATIENT_CLINIC_OR_DEPARTMENT_OTHER): Payer: Self-pay

## 2024-03-13 DIAGNOSIS — T148XXA Other injury of unspecified body region, initial encounter: Secondary | ICD-10-CM

## 2024-03-13 DIAGNOSIS — D171 Benign lipomatous neoplasm of skin and subcutaneous tissue of trunk: Secondary | ICD-10-CM | POA: Insufficient documentation

## 2024-03-13 DIAGNOSIS — X509XXA Other and unspecified overexertion or strenuous movements or postures, initial encounter: Secondary | ICD-10-CM | POA: Insufficient documentation

## 2024-03-13 DIAGNOSIS — Y990 Civilian activity done for income or pay: Secondary | ICD-10-CM | POA: Insufficient documentation

## 2024-03-13 DIAGNOSIS — M25512 Pain in left shoulder: Secondary | ICD-10-CM | POA: Insufficient documentation

## 2024-03-13 MED ORDER — LIDOCAINE 5 % EX PTCH
1.0000 | MEDICATED_PATCH | CUTANEOUS | Status: DC
  Administered 2024-03-13: 1 via TRANSDERMAL
  Filled 2024-03-13: qty 1

## 2024-03-13 MED ORDER — METHOCARBAMOL 500 MG PO TABS
500.0000 mg | ORAL_TABLET | Freq: Once | ORAL | Status: AC
  Administered 2024-03-13: 500 mg via ORAL
  Filled 2024-03-13: qty 1

## 2024-03-13 MED ORDER — LIDOCAINE 4 % EX PTCH
1.0000 | MEDICATED_PATCH | Freq: Two times a day (BID) | CUTANEOUS | 0 refills | Status: AC
  Filled 2024-03-13: qty 12, 6d supply, fill #0

## 2024-03-13 MED ORDER — NAPROXEN 250 MG PO TABS
500.0000 mg | ORAL_TABLET | Freq: Once | ORAL | Status: AC
  Administered 2024-03-13: 500 mg via ORAL
  Filled 2024-03-13: qty 2

## 2024-03-13 MED ORDER — METHOCARBAMOL 500 MG PO TABS
500.0000 mg | ORAL_TABLET | Freq: Two times a day (BID) | ORAL | 0 refills | Status: AC
  Filled 2024-03-13: qty 20, 10d supply, fill #0

## 2024-03-13 NOTE — ED Triage Notes (Signed)
 Arrives ambulatory to the ED with complaints of upper back pain that started yesterday when she was working. She then developed pain on the right side of her abdomen overnight.  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

## 2024-03-13 NOTE — ED Provider Notes (Signed)
 [place] EMERGENCY DEPARTMENT AT [facility] Provider Note   CSN: [id number] Arrival date & time: 03/13/24  9070     Patient presents with: Back Pain and Abdominal Pain   Shannon Wolfe is a 33 y.o. female.   HPI     33 year old patient comes in with chief complaint of left-sided back pain. 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  When she was done finishing her job, and climbing down the ladder she started experiencing severe pain in the left scapular region.  The pain is worse with any activity.  She also reports that on the right abdominal area she has a lipoma, and coincidently it also started becoming more painful yesterday.  Patient denies any numbness, tingling, neck pain, trauma.  Prior to Admission medications   Medication Sig Start Date End Date Taking? Authorizing Provider  lidocaine  4 % Place 1 patch onto the skin 2 (two) times daily. 03/13/24  Yes Charlyn Sora, MD  methocarbamol  (ROBAXIN ) 500 MG tablet Take 1 tablet (500 mg total) by mouth 2 (two) times daily. 03/13/24  Yes Charlyn Sora, MD  cephALEXin  (KEFLEX ) 500 MG capsule Take 1 capsule (500 mg total) by mouth 4 (four) times daily. 09/25/17   Law, Lorane HERO, PA-C  clindamycin  (CLEOCIN ) 300 MG capsule Take 1 capsule (300 mg total) by mouth 4 (four) times daily. 06/02/17   Hongalgi, Anand D, MD  diphenhydrAMINE  (BENADRYL ) 25 MG tablet Take 1 tablet (25 mg total) by mouth every 6 (six) hours. 09/25/17   Law, Alexandra M, PA-C  hydrocortisone  2.5 % lotion Apply topically 2 (two) times daily. 09/25/17   Law, Alexandra M, PA-C  ibuprofen  (ADVIL ,MOTRIN ) 600 MG tablet Take 1 tablet (600 mg total) by mouth every 6 (six) hours as needed. 12/13/17   Odell Balls, PA-C  ondansetron  (ZOFRAN ) 4 MG tablet Take 1 tablet (4 mg total) by mouth every 6 (six) hours. 06/21/23   Mannie Fairy DASEN, DO    Allergies: Patient has no known allergies.    Review of  Systems  All other systems reviewed and are negative.   Updated Vital Signs BP 109/67   Pulse 80   Temp 97.7 F (36.5 C) (Oral)   Resp 16   Ht 5' (1.524 m)   Wt 68 kg   SpO2 99%   BMI 29.28 kg/m   Physical Exam Vitals and nursing note reviewed.  Constitutional:      Appearance: She is well-developed.  HENT:     Head: Atraumatic.  Cardiovascular:     Rate and Rhythm: Normal rate.  Pulmonary:     Effort: Pulmonary effort is normal.  Musculoskeletal:     Cervical back: Normal range of motion and neck supple.  Skin:    General: Skin is warm and dry.     Comments: Patient has tenderness over the left scapular region with any palpation.  There is some appreciable spasming in the left scapular region.  In the right upper abdominal quadrant patient has a soft mass measuring about 7 to 9 cm.  The lesion is mobile and nontender.  Neurological:     Mental Status: She is alert and oriented to person, place, and time.     (all labs ordered are listed, but only abnormal results are displayed) Labs Reviewed - No data to display  EKG: None  Radiology: No results found.   Procedures   Medications Ordered in the ED  lidocaine  (  LIDODERM ) 5 % 1 patch (1 patch Transdermal Patch Applied 03/13/24 1045)  methocarbamol  (ROBAXIN ) tablet 500 mg (has no administration in time range)  naproxen  (NAPROSYN ) tablet 500 mg (has no administration in time range)                                    Medical Decision Making Risk OTC drugs. Prescription drug management.   33 year old female comes in with chief complaint of left-sided scapular pain and also right-sided abdominal wall pain.  Differential diagnosis for the abdominal wall pain includes lipoma, cyst.  This lesion has been present for over 10 years, and off-and-on it will get aggravated.  It does not seem infected right now.  Patient feels that she would like for it to be removed possibly, therefore we will give her general  surgery information.  Patient's main complaint is this left-sided scapular pain.  Her job sent her to the ER because of the pain.  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  On exam, the symptoms are reproducible with movement of the left upper extremity and with palpation of the left scapular region.  We will treat this conservatively and advised work restriction.  Final diagnoses:  Pulled muscle  Lipoma of abdominal wall    ED Discharge Orders          Ordered    methocarbamol  (ROBAXIN ) 500 MG tablet  2 times daily        03/13/24 1043    lidocaine  4 %  2 times daily        03/13/24 1045               Charlyn Sora, MD 03/13/24 1050

## 2024-03-13 NOTE — Discharge Instructions (Addendum)
 Most likely you have a muscle strain that is causing the pain in the back.  Make sure you continue to stretch the muscle throughout the day.  Avoid any lifting over 10 pounds and any amount of weeding above your shoulder level.  Apply ice to the affected area and then you can alternate between heat and ice starting tomorrow.  For your lipoma, if you so desire to get it removed, call the general surgeon at the number provided to set up an appointment.
